# Patient Record
Sex: Male | Born: 1940 | Race: White | Hispanic: No | Marital: Married | Smoking: Never smoker
Health system: Southern US, Community
[De-identification: ages and names within clinical notes are randomized; demographics above are authoritative.]

## PROBLEM LIST (undated history)

## (undated) DIAGNOSIS — L719 Rosacea, unspecified: Secondary | ICD-10-CM

## (undated) DIAGNOSIS — K115 Sialolithiasis: Secondary | ICD-10-CM

## (undated) DIAGNOSIS — G4733 Obstructive sleep apnea (adult) (pediatric): Secondary | ICD-10-CM

## (undated) DIAGNOSIS — G4730 Sleep apnea, unspecified: Secondary | ICD-10-CM

## (undated) DIAGNOSIS — N281 Cyst of kidney, acquired: Secondary | ICD-10-CM

## (undated) DIAGNOSIS — Z8546 Personal history of malignant neoplasm of prostate: Secondary | ICD-10-CM

## (undated) DIAGNOSIS — E785 Hyperlipidemia, unspecified: Secondary | ICD-10-CM

## (undated) DIAGNOSIS — Z860101 Personal history of adenomatous and serrated colon polyps: Secondary | ICD-10-CM

## (undated) DIAGNOSIS — I10 Essential (primary) hypertension: Secondary | ICD-10-CM

## (undated) DIAGNOSIS — E1169 Type 2 diabetes mellitus with other specified complication: Secondary | ICD-10-CM

## (undated) DIAGNOSIS — E119 Type 2 diabetes mellitus without complications: Secondary | ICD-10-CM

## (undated) DIAGNOSIS — C61 Malignant neoplasm of prostate: Secondary | ICD-10-CM

## (undated) DIAGNOSIS — E559 Vitamin D deficiency, unspecified: Secondary | ICD-10-CM

## (undated) HISTORY — DX: Obstructive sleep apnea (adult) (pediatric): G47.33

## (undated) HISTORY — DX: Personal history of malignant neoplasm of prostate: Z85.46

## (undated) HISTORY — DX: Personal history of adenomatous and serrated colon polyps: Z86.0101

## (undated) HISTORY — DX: Type 2 diabetes mellitus with other specified complication: E11.69

## (undated) HISTORY — DX: Rosacea, unspecified: L71.9

## (undated) HISTORY — DX: Vitamin D deficiency, unspecified: E55.9

## (undated) HISTORY — DX: Hyperlipidemia, unspecified: E78.5

## (undated) HISTORY — PX: TONSILLECTOMY: SUR1361

## (undated) HISTORY — DX: Sialolithiasis: K11.5

## (undated) HISTORY — PX: PROSTATECTOMY: SHX69

## (undated) HISTORY — DX: Cyst of kidney, acquired: N28.1

---

## 1998-09-30 HISTORY — PX: CHOLECYSTECTOMY: SHX55

## 1999-09-12 ENCOUNTER — Encounter: Admission: RE | Admit: 1999-09-12 | Discharge: 1999-09-12 | Payer: Self-pay | Admitting: Internal Medicine

## 1999-09-12 ENCOUNTER — Encounter: Payer: Self-pay | Admitting: Internal Medicine

## 1999-09-21 ENCOUNTER — Ambulatory Visit (HOSPITAL_COMMUNITY): Admission: RE | Admit: 1999-09-21 | Discharge: 1999-09-21 | Payer: Self-pay | Admitting: Internal Medicine

## 1999-09-21 ENCOUNTER — Encounter: Payer: Self-pay | Admitting: Internal Medicine

## 1999-10-26 ENCOUNTER — Encounter (INDEPENDENT_AMBULATORY_CARE_PROVIDER_SITE_OTHER): Payer: Self-pay | Admitting: Specialist

## 1999-10-26 ENCOUNTER — Observation Stay (HOSPITAL_COMMUNITY): Admission: RE | Admit: 1999-10-26 | Discharge: 1999-10-27 | Payer: Self-pay | Admitting: Surgery

## 1999-10-26 ENCOUNTER — Encounter: Payer: Self-pay | Admitting: Surgery

## 2001-10-09 ENCOUNTER — Ambulatory Visit (HOSPITAL_BASED_OUTPATIENT_CLINIC_OR_DEPARTMENT_OTHER): Admission: RE | Admit: 2001-10-09 | Discharge: 2001-10-09 | Payer: Self-pay | Admitting: Internal Medicine

## 2001-11-13 ENCOUNTER — Ambulatory Visit (HOSPITAL_BASED_OUTPATIENT_CLINIC_OR_DEPARTMENT_OTHER): Admission: RE | Admit: 2001-11-13 | Discharge: 2001-11-13 | Payer: Self-pay | Admitting: Endocrinology

## 2006-10-30 ENCOUNTER — Encounter: Admission: RE | Admit: 2006-10-30 | Discharge: 2006-10-30 | Payer: Self-pay | Admitting: Internal Medicine

## 2007-06-23 ENCOUNTER — Encounter: Admission: RE | Admit: 2007-06-23 | Discharge: 2007-06-23 | Payer: Self-pay | Admitting: Internal Medicine

## 2010-11-05 ENCOUNTER — Other Ambulatory Visit: Payer: Self-pay | Admitting: Internal Medicine

## 2010-11-05 DIAGNOSIS — R1011 Right upper quadrant pain: Secondary | ICD-10-CM

## 2010-11-05 DIAGNOSIS — N281 Cyst of kidney, acquired: Secondary | ICD-10-CM

## 2010-11-08 ENCOUNTER — Ambulatory Visit
Admission: RE | Admit: 2010-11-08 | Discharge: 2010-11-08 | Disposition: A | Discharge status: Home / Self Care | Payer: MEDICARE | Source: Ambulatory Visit | Attending: Internal Medicine | Admitting: Internal Medicine

## 2010-11-08 DIAGNOSIS — N281 Cyst of kidney, acquired: Secondary | ICD-10-CM

## 2010-11-08 DIAGNOSIS — R1011 Right upper quadrant pain: Secondary | ICD-10-CM

## 2016-01-29 HISTORY — PX: HERNIA REPAIR: SHX51

## 2016-04-18 ENCOUNTER — Other Ambulatory Visit: Payer: Self-pay | Admitting: Gastroenterology

## 2016-05-16 ENCOUNTER — Encounter (HOSPITAL_COMMUNITY): Payer: Self-pay | Admitting: *Deleted

## 2016-05-27 ENCOUNTER — Ambulatory Visit (HOSPITAL_COMMUNITY): Payer: Medicare Other | Admitting: Registered Nurse

## 2016-05-27 ENCOUNTER — Ambulatory Visit (HOSPITAL_COMMUNITY)
Admission: RE | Admit: 2016-05-27 | Discharge: 2016-05-27 | Disposition: A | Discharge status: Home / Self Care | Payer: Medicare Other | Source: Ambulatory Visit | Attending: Gastroenterology | Admitting: Gastroenterology

## 2016-05-27 ENCOUNTER — Encounter (HOSPITAL_COMMUNITY)
Admission: RE | Disposition: A | Discharge status: Home / Self Care | Payer: Self-pay | Source: Ambulatory Visit | Attending: Gastroenterology

## 2016-05-27 ENCOUNTER — Encounter (HOSPITAL_COMMUNITY): Payer: Self-pay | Admitting: Registered Nurse

## 2016-05-27 DIAGNOSIS — Z9079 Acquired absence of other genital organ(s): Secondary | ICD-10-CM | POA: Diagnosis not present

## 2016-05-27 DIAGNOSIS — Z7984 Long term (current) use of oral hypoglycemic drugs: Secondary | ICD-10-CM | POA: Diagnosis not present

## 2016-05-27 DIAGNOSIS — E119 Type 2 diabetes mellitus without complications: Secondary | ICD-10-CM | POA: Diagnosis not present

## 2016-05-27 DIAGNOSIS — Z1211 Encounter for screening for malignant neoplasm of colon: Secondary | ICD-10-CM | POA: Insufficient documentation

## 2016-05-27 DIAGNOSIS — Z7982 Long term (current) use of aspirin: Secondary | ICD-10-CM | POA: Diagnosis not present

## 2016-05-27 DIAGNOSIS — D120 Benign neoplasm of cecum: Secondary | ICD-10-CM | POA: Insufficient documentation

## 2016-05-27 DIAGNOSIS — G4733 Obstructive sleep apnea (adult) (pediatric): Secondary | ICD-10-CM | POA: Insufficient documentation

## 2016-05-27 DIAGNOSIS — E7800 Pure hypercholesterolemia, unspecified: Secondary | ICD-10-CM | POA: Diagnosis not present

## 2016-05-27 DIAGNOSIS — Z79899 Other long term (current) drug therapy: Secondary | ICD-10-CM | POA: Insufficient documentation

## 2016-05-27 DIAGNOSIS — Z9049 Acquired absence of other specified parts of digestive tract: Secondary | ICD-10-CM | POA: Insufficient documentation

## 2016-05-27 DIAGNOSIS — Z8546 Personal history of malignant neoplasm of prostate: Secondary | ICD-10-CM | POA: Diagnosis not present

## 2016-05-27 DIAGNOSIS — I10 Essential (primary) hypertension: Secondary | ICD-10-CM | POA: Insufficient documentation

## 2016-05-27 DIAGNOSIS — Z86010 Personal history of colonic polyps: Secondary | ICD-10-CM | POA: Insufficient documentation

## 2016-05-27 HISTORY — DX: Essential (primary) hypertension: I10

## 2016-05-27 HISTORY — DX: Type 2 diabetes mellitus without complications: E11.9

## 2016-05-27 HISTORY — DX: Sleep apnea, unspecified: G47.30

## 2016-05-27 HISTORY — PX: COLONOSCOPY WITH PROPOFOL: SHX5780

## 2016-05-27 LAB — GLUCOSE, CAPILLARY: GLUCOSE-CAPILLARY: 96 mg/dL (ref 65–99)

## 2016-05-27 SURGERY — COLONOSCOPY WITH PROPOFOL
Anesthesia: Monitor Anesthesia Care

## 2016-05-27 MED ORDER — PROPOFOL 10 MG/ML IV BOLUS
INTRAVENOUS | Status: DC | PRN
  Administered 2016-05-27: 30 mg via INTRAVENOUS

## 2016-05-27 MED ORDER — PROPOFOL 10 MG/ML IV BOLUS
INTRAVENOUS | Status: AC
  Filled 2016-05-27: qty 40

## 2016-05-27 MED ORDER — SODIUM CHLORIDE 0.9 % IV SOLN: INTRAVENOUS | Status: DC

## 2016-05-27 MED ORDER — LIDOCAINE HCL (CARDIAC) 20 MG/ML IV SOLN
INTRAVENOUS | Status: DC | PRN
  Administered 2016-05-27: 100 mg via INTRAVENOUS

## 2016-05-27 MED ORDER — LIDOCAINE HCL (CARDIAC) 20 MG/ML IV SOLN
INTRAVENOUS | Status: AC
  Filled 2016-05-27: qty 5

## 2016-05-27 MED ORDER — PROPOFOL 500 MG/50ML IV EMUL
INTRAVENOUS | Status: DC | PRN
  Administered 2016-05-27: 140 ug/kg/min via INTRAVENOUS

## 2016-05-27 MED ORDER — LACTATED RINGERS IV SOLN
INTRAVENOUS | Status: DC
  Administered 2016-05-27: 1000 mL via INTRAVENOUS

## 2016-05-27 SURGICAL SUPPLY — 21 items

## 2016-05-27 NOTE — H&P (Signed)
  Procedure: Surveillance colonoscopy. 12/25/2009 colonoscopy was performed with removal of two diminutive cecal tubular adenomatous polyps  History: The patient is a 75 year old male born 03-17-1941. He is scheduled to undergo a surveillance colonoscopy today. He has obstructive sleep apnea syndrome.  Past medical history: Hypertension. Hypercholesterolemia. Obstructive sleep apnea syndrome. Allergic rhinitis. Eczema. Kidney stones. Complex kidney cyst. Hearing loss. Prostate cancer surgery. Cholecystectomy. Ventral hernia repair. Robotic prostatectomy.  Medication allergies: Crestor caused severe fatigue  Exam: The patient is alert and lying comfortably on the endoscopy stretcher. Abdomen is soft and nontender to palpation. Lungs are clear to auscultation. Cardiac exam reveals a regular rhythm.  Plan: Proceed with surveillance colonoscopy

## 2016-05-27 NOTE — Anesthesia Preprocedure Evaluation (Addendum)
Anesthesia Evaluation  Patient identified by MRN, date of birth, ID band Patient awake    Reviewed: Allergy & Precautions  Airway Mallampati: II  TM Distance: >3 FB     Dental   Pulmonary sleep apnea ,    breath sounds clear to auscultation       Cardiovascular hypertension,  Rhythm:Regular Rate:Normal     Neuro/Psych    GI/Hepatic negative GI ROS, Neg liver ROS,   Endo/Other  diabetes  Renal/GU negative Renal ROS     Musculoskeletal   Abdominal   Peds  Hematology   Anesthesia Other Findings   Reproductive/Obstetrics                             Anesthesia Physical Anesthesia Plan  ASA: III  Anesthesia Plan: MAC   Post-op Pain Management:    Induction: Intravenous  Airway Management Planned: Simple Face Mask  Additional Equipment:   Intra-op Plan:   Post-operative Plan:   Informed Consent: I have reviewed the patients History and Physical, chart, labs and discussed the procedure including the risks, benefits and alternatives for the proposed anesthesia with the patient or authorized representative who has indicated his/her understanding and acceptance.   Dental advisory given  Plan Discussed with: CRNA and Anesthesiologist  Anesthesia Plan Comments:         Anesthesia Quick Evaluation

## 2016-05-27 NOTE — Anesthesia Postprocedure Evaluation (Signed)
Anesthesia Post Note  Patient: Craig Fuentes  Procedure(s) Performed: Procedure(s) (LRB): COLONOSCOPY WITH PROPOFOL (N/A)  Patient location during evaluation: Endoscopy Anesthesia Type: MAC Pain management: pain level controlled Respiratory status: spontaneous breathing Cardiovascular status: stable Anesthetic complications: no    Last Vitals:  Vitals:   05/27/16 1130 05/27/16 1140  BP: (!) 145/47 (!) 144/50  Pulse: 61 (!) 52  Resp: 12 16  Temp:      Last Pain:  Vitals:   05/27/16 1120  TempSrc: Oral                 EDWARDS,Lonia Roane

## 2016-05-27 NOTE — Discharge Instructions (Signed)

## 2016-05-27 NOTE — Transfer of Care (Signed)
Immediate Anesthesia Transfer of Care Note  Patient: Craig Fuentes  Procedure(s) Performed: Procedure(s): COLONOSCOPY WITH PROPOFOL (N/A)  Patient Location: PACU and Endoscopy Unit  Anesthesia Type:MAC  Level of Consciousness: awake, alert , oriented and patient cooperative  Airway & Oxygen Therapy: Patient Spontanous Breathing and Patient connected to face mask oxygen  Post-op Assessment: Report given to RN, Post -op Vital signs reviewed and stable and Patient moving all extremities  Post vital signs: Reviewed and stable  Last Vitals:  Vitals:   05/27/16 1016  BP: (!) 165/52  Resp: 13  Temp: 36.7 C    Last Pain:  Vitals:   05/27/16 1016  TempSrc: Oral         Complications: No apparent anesthesia complications

## 2016-05-27 NOTE — Op Note (Signed)
[facility] Patient Name: Craig Fuentes Procedure Date: 05/27/2016 MRN: [id number] Attending MD: Garlan Fair , MD Date of Birth: 04-Jan-1941 CSN: [id number] Age: 75 Admit Type: Outpatient Procedure:                Colonoscopy Indications:              High risk colon cancer surveillance: Personal                            history of adenoma less than 10 mm in size Providers:                Garlan Fair, MD, Laverta Baltimore RN, RN, Alfonso Patten, Technician, Courtney Heys. Armistead, CRNA Referring MD:              Medicines:                Propofol per Anesthesia Complications:            No immediate complications. Estimated Blood Loss:     Estimated blood loss: none. Procedure:                Pre-Anesthesia Assessment:                           - Prior to the procedure, a History and Physical                            was performed, and patient medications and                            allergies were reviewed. The patient's tolerance of                            previous anesthesia was also reviewed. The risks                            and benefits of the procedure and the sedation                            options and risks were discussed with the patient.                            All questions were answered, and informed consent                            was obtained. Prior Anticoagulants: The patient has                            taken aspirin, last dose was 1 day prior to                            procedure. ASA Grade Assessment: III - A patient  with severe systemic disease. After reviewing the                            risks and benefits, the patient was deemed in                            satisfactory condition to undergo the procedure.                           After obtaining informed consent, the colonoscope                            was passed under direct vision. Throughout the                     procedure, the patient's blood pressure, pulse, and                            oxygen saturations were monitored continuously. The                            [id number] scope was introduced through                            the anus and advanced to the the cecum, identified                            by appendiceal orifice and ileocecal valve. The                            colonoscopy was performed without difficulty. The                            patient tolerated the procedure well. The quality                            of the bowel preparation was good. The ileocecal                            valve, the appendiceal orifice and the rectum were                            photographed. Scope In: 10:50:27 AM Scope Out: 11:12:54 AM Scope Withdrawal Time: 0 hours 16 minutes 7 seconds  Total Procedure Duration: 0 hours 22 minutes 27 seconds  Findings:      The perianal and digital rectal examinations were normal.      A 4 mm polyp was found in the cecum. The polyp was sessile. The polyp       was removed with a cold biopsy forceps. Resection and retrieval were       complete.      The exam was otherwise without abnormality. Impression:               - One 4 mm polyp in the cecum, removed with a cold  biopsy forceps. Resected and retrieved.                           - The examination was otherwise normal. Moderate Sedation:      N/A- Per Anesthesia Care Recommendation:           - Patient has a contact number available for                            emergencies. The signs and symptoms of potential                            delayed complications were discussed with the                            patient. Return to normal activities tomorrow.                            Written discharge instructions were provided to the                            patient.                           - Repeat colonoscopy date to be determined after                             pending pathology results are reviewed for                            surveillance.                           - Resume previous diet.                           - Continue present medications. Procedure Code(s):        --- Professional ---                           [phone], Colonoscopy, flexible; with biopsy, single                            or multiple Diagnosis Code(s):        --- Professional ---                           Z86.010, Personal history of colonic polyps                           D12.0, Benign neoplasm of cecum CPT copyright 2016 American Medical Association. All rights reserved. The codes documented in this report are preliminary and upon coder review may  be revised to meet current compliance requirements. Earle Gell, MD Garlan Fair, MD 05/27/2016 11:19:43 AM This report has been signed electronically. Number of Addenda: 0

## 2016-05-29 ENCOUNTER — Encounter (HOSPITAL_COMMUNITY): Payer: Self-pay | Admitting: Gastroenterology

## 2018-02-10 DIAGNOSIS — H1131 Conjunctival hemorrhage, right eye: Secondary | ICD-10-CM | POA: Insufficient documentation

## 2018-09-16 ENCOUNTER — Encounter: Payer: Self-pay | Admitting: Radiation Oncology

## 2018-09-16 ENCOUNTER — Telehealth: Payer: Self-pay | Admitting: Radiation Oncology

## 2018-09-16 NOTE — Telephone Encounter (Signed)
Phoned patient to remind him of his appointment tomorrow and encourage him to bring any records he may have with him specifically pathology. No answer. Left message detailing this information and my contact information.

## 2018-09-16 NOTE — Progress Notes (Signed)
Radiation Oncology         [phone] ________________________________  Initial outpatient Consultation  Name: Craig Fuentes MRN: [id number]  Date: 09/17/2018  DOB: November 29, 1940  [id number], Herbie Baltimore, MD  Franchot Gallo, MD   REFERRING PHYSICIAN: Franchot Gallo, MD  DIAGNOSIS: 77 y.o. 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    ICD-10-CM   1. Malignant neoplasm of prostate ([place]) C61     HISTORY OF PRESENT ILLNESS: Craig Fuentes is a 77 y.o. male with a diagnosis of biochemical recurrent prostate cancer. He was initially diagnosed at the [facility] in early 2016. He proceeded to robotic prostatectomy with bilateral pelvic lymph node dissections on 01/17/15. He was noted to have rising PSA s/p prostatectomy of 0.12 by his primary care physician, Dr. Inda Merlin.  Accordingly, he was referred for evaluation in urology by Dr. Diona Fanti on 08/17/18. His original pathology records are not currently available, but apparently PSA was around 3.5. He is scheduled to follow up with Dr. Dorina Hoyer on 11/23/18.  He apparently underwent bone scan on 01/09/18, but we don't currently have records of it here. He underwent pelvic MRI in 06/2018 which showed no pelvic abnormality or mass within the prostatic bed.   The patient reviewed his recent PSA results and the implications of a rising PSA post-prostatectomy with his urologist and he has kindly been referred today for discussion of potential salvage radiation treatment options.   PREVIOUS RADIATION THERAPY: No  PAST MEDICAL HISTORY:  Past Medical History:  Diagnosis Date  . Diabetes mellitus without complication ([place])   . Hypertension   . Prostate cancer ([place])   . Sleep apnea    use CPAP      PAST SURGICAL HISTORY: Past Surgical History:  Procedure Laterality Date  . CHOLECYSTECTOMY  2000  . COLONOSCOPY WITH PROPOFOL N/A 05/27/2016   Procedure: COLONOSCOPY  WITH PROPOFOL;  Surgeon: Garlan Fair, MD;  Location: [facility];  Service: Endoscopy;  Laterality: N/A;  . HERNIA REPAIR  17/4081   umbilical incisional hernia repair  . PROSTATECTOMY    . TONSILLECTOMY     as child    FAMILY HISTORY:  Family History  Problem Relation Age of Onset  . Cirrhosis Father   . Breast cancer Neg Hx   . Prostate cancer Neg Hx   . Pancreatic cancer Neg Hx   . Colon cancer Neg Hx     SOCIAL HISTORY:  Social History   Socioeconomic History  . Marital status: Married    Spouse name: Not on file  . Number of children: Not on file  . Years of education: Not on file  . Highest education level: Not on file  Occupational History    Comment: self employed  Social Needs  . Financial resource strain: Not on file  . Food insecurity:    Worry: Not on file    Inability: Not on file  . Transportation needs:    Medical: Not on file    Non-medical: Not on file  Tobacco Use  . Smoking status: Never Smoker  . Smokeless tobacco: Never Used  Substance and Sexual Activity  . Alcohol use: Yes  . Drug use: No  . Sexual activity: Not Currently  Lifestyle  . Physical activity:    Days per week: Not on file    Minutes per session: Not on file  . Stress: Not on file  Relationships  . Social connections:  Talks on phone: Not on file    Gets together: Not on file    Attends religious service: Not on file    Active member of club or organization: Not on file    Attends meetings of clubs or organizations: Not on file    Relationship status: Not on file  . Intimate partner violence:    Fear of current or ex partner: Not on file    Emotionally abused: Not on file    Physically abused: Not on file    Forced sexual activity: Not on file  Other Topics Concern  . Not on file  Social History Narrative  . Not on file    ALLERGIES: Rosuvastatin  MEDICATIONS:  Current Outpatient Medications  Medication Sig Dispense Refill  . amLODipine (NORVASC) 5 MG  tablet Take 2.5 mg by mouth daily.    Marland Kitchen aspirin EC 81 MG tablet Take 162 mg by mouth daily.    Marland Kitchen atorvastatin (LIPITOR) 20 MG tablet Take 20 mg by mouth daily.    Marland Kitchen b complex vitamins tablet Take 1 tablet by mouth daily.    . Biotin 5000 MCG CAPS Take 1 capsule by mouth daily.    . Cholecalciferol (VITAMIN D) 2000 units CAPS Take 1 capsule by mouth daily.    Marland Kitchen docusate sodium (COLACE) 100 MG capsule Take 100 mg by mouth 2 (two) times daily.    Marland Kitchen lisinopril-hydrochlorothiazide (PRINZIDE,ZESTORETIC) 10-12.5 MG tablet Take 1 tablet by mouth daily.    . metFORMIN (GLUCOPHAGE) 500 MG tablet Take 500 mg by mouth daily.    . potassium chloride SA (K-DUR,KLOR-CON) 20 MEQ tablet Take 20 mEq by mouth daily.     No current facility-administered medications for this encounter.     REVIEW OF SYSTEMS:  On review of systems, the patient reports that he is doing well overall. He denies any chest pain, shortness of breath, cough, fevers, chills, night sweats, unintended weight changes. He denies any bowel disturbances, and denies abdominal pain, nausea or vomiting. He denies any new musculoskeletal or joint aches or pains. His IPSS was 1, indicating mild to no urinary symptoms. His SHIM was 25; he notes using Viagra with great success. A complete review of systems is obtained and is otherwise negative.    PHYSICAL EXAM:  Wt Readings from Last 3 Encounters:  09/17/18 165 lb 6 oz (75 kg)  05/27/16 158 lb (71.7 kg)   Temp Readings from Last 3 Encounters:  09/17/18 97.6 F (36.4 C) (Oral)  05/27/16 97.4 F (36.3 C) (Oral)   BP Readings from Last 3 Encounters:  09/17/18 (!) 141/62  05/27/16 (!) 144/50   Pulse Readings from Last 3 Encounters:  09/17/18 62  05/27/16 (!) 52   Pain Assessment Pain Score: 0-No pain/10 No acute distress, unlabored breathing, alert and oriented x4, normal affect.  KPS = 100  100 - Normal; no complaints; no evidence of disease. 90   - Able to carry on normal activity;  minor signs or symptoms of disease. 80   - Normal activity with effort; some signs or symptoms of disease. 42   - Cares for self; unable to carry on normal activity or to do active work. 60   - Requires occasional assistance, but is able to care for most of his personal needs. 50   - Requires considerable assistance and frequent medical care. 28   - Disabled; requires special care and assistance. 62   - Severely disabled; hospital admission is indicated although death not  imminent. 49   - Very sick; hospital admission necessary; active supportive treatment necessary. 10   - Moribund; fatal processes progressing rapidly. 0     - Dead  Karnofsky DA, Abelmann WH, Craver LS and Burchenal JH [phone]) The use of the nitrogen mustards in the palliative treatment of carcinoma: with particular reference to bronchogenic carcinoma Cancer 1 [phone]  LABORATORY DATA:  No results found for: WBC, HGB, HCT, MCV, PLT No results found for: NA, K, CL, CO2 No results found for: ALT, AST, GGT, ALKPHOS, BILITOT   RADIOGRAPHY: No results found.    IMPRESSION/PLAN: 1. 78 y.o. gentleman with biochemically recurrent adenocarcinoma of the prostate with Gleason Score of 3+3, and post-op detectable rising PSA of 0.12. We discussed the patient's workup and outlines the nature of biochemical recurrent prostate cancer in this setting. Accordingly, he is eligible for salvage external radiation. We discussed the available radiation techniques, and focused on the details and logistics and delivery. We discussed and outlined the risks, benefits, short and long-term effects associated with radiotherapy.   We will connect him with our financial counselors to discuss costs. We will also reach out to [facility] to receive his previous pathology records (patient states his pathologist was Dr. Larena Glassman). Once we obtain the pathology report, we can determine the best form of radiation treatment based on the results.  The patient plans to see  Dr. Diona Fanti back on 2/21 after PSA on 2/17 and may elect radiotherapy at that time.  We spent time face to face with the patient and more than 50% of that time was spent in counseling and/or coordination of care.   _____________________________________  Sheral Apley. Tammi Klippel, M.D.   This document serves as a record of services personally performed by Tyler Pita, MD. It was created on his behalf by Wilburn Mylar, a trained medical scribe. The creation of this record is based on the scribe's personal observations and the provider's statements to them. This document has been checked and approved by the attending provider.

## 2018-09-16 NOTE — Progress Notes (Addendum)
GU Location of Tumor / Histology: Biochemical recurrent prostatic adenocarcinoma  If Prostate Cancer, Gleason Score is (3 + 3) and PSA (3.5) pre treatment. Prostate volume unknown.    Tania Ade underwent a robotic assisted radical prostatectomy with probable bilateral pelvic lymph node dissections in April of 2016. Following surgery his PSA went to 0 but over the past year the PSA has gone to 0.06 and 0.12 more recently.    Past/Anticipated interventions by urology, if any: prostate biopsy, prostatectomy, referral for consideration of salvage radiotherapy.  Past/Anticipated interventions by medical oncology, if any: no  Weight changes, if any: no  Bowel/Bladder complaints, if any: IPSS 1. SHIM 25. Denies significant leakage. Able to maintain erections with Viagra. Reports a good stream without difficulty emptying his bladder. Denies hematuria.   Nausea/Vomiting, if any: no  Pain issues, if any:  no, denies bony pain  SAFETY ISSUES:  Prior radiation? no  Pacemaker/ICD? no  Possible current pregnancy? no, male patient  Is the patient on methotrexate? no  Current Complaints / other details:  77 year old male. Married. Denies family history of cancer.

## 2018-09-17 ENCOUNTER — Encounter: Payer: Self-pay | Admitting: Medical Oncology

## 2018-09-17 ENCOUNTER — Encounter: Payer: Self-pay | Admitting: Radiation Oncology

## 2018-09-17 ENCOUNTER — Ambulatory Visit
Admission: RE | Admit: 2018-09-17 | Discharge: 2018-09-17 | Disposition: A | Discharge status: Home / Self Care | Payer: Medicare Other | Source: Ambulatory Visit | Attending: Radiation Oncology | Admitting: Radiation Oncology

## 2018-09-17 ENCOUNTER — Other Ambulatory Visit: Payer: Self-pay

## 2018-09-17 VITALS — BP 141/62 | HR 62 | Temp 97.6°F | Resp 18 | Ht 68.0 in | Wt 165.4 lb

## 2018-09-17 DIAGNOSIS — Z79899 Other long term (current) drug therapy: Secondary | ICD-10-CM | POA: Diagnosis not present

## 2018-09-17 DIAGNOSIS — I10 Essential (primary) hypertension: Secondary | ICD-10-CM | POA: Insufficient documentation

## 2018-09-17 DIAGNOSIS — C61 Malignant neoplasm of prostate: Secondary | ICD-10-CM

## 2018-09-17 DIAGNOSIS — E119 Type 2 diabetes mellitus without complications: Secondary | ICD-10-CM | POA: Diagnosis not present

## 2018-09-17 DIAGNOSIS — Z7984 Long term (current) use of oral hypoglycemic drugs: Secondary | ICD-10-CM | POA: Diagnosis not present

## 2018-09-17 HISTORY — DX: Malignant neoplasm of prostate: C61

## 2018-09-17 NOTE — Progress Notes (Signed)
See progress note under physician encounter. 

## 2018-09-17 NOTE — Progress Notes (Signed)
Introduced myself to Mr. Craig Fuentes as the prostate nurse navigator and my role. He states he had prostatectomy 6 years ago at [facility]. His PSA has started to rise and Dr. Inda Merlin referred him to Dr. Diona Fanti. We have limited records so not sure of surgical path results. Records have been requested. He is here to discuss radiation to the prostate fossa. I gave him my business card and asked him to call me with questions or concerns.

## 2018-10-16 ENCOUNTER — Telehealth: Payer: Self-pay | Admitting: Urology

## 2018-10-16 NOTE — Telephone Encounter (Signed)
I spoke with the patient by phone to inform him that we had received his final pathology report from the [facility] regarding his prostatectomy in 2016.  Dr. Tammi Klippel has reviewed the reports and feels that he is indeed a good candidate for early salvage radiotherapy to the prostate fossa based on his current postoperative PSA of 0.12.  We discussed the recommended course of 7-1/2 weeks of daily radiotherapy treatment to the prostate fossa delivered over 38 fractions.  In our conversation, the patient related that he lives in [place] which is approximately 1-1/2 hours away from our clinic.  He is interested in learning more about his financial responsibility as it relates to his radiation treatments prior to committing to move forward with treatment and mentiond that he might want to seek treatment closer to home due to frequency of treatments recommended.  He has a follow-up visit scheduled with Dr. Diona Fanti on 11/20/2018 and is planning to make a decision regarding moving forward with salvage radiation versus continuing in active surveillance at that time.  I sent an inbox message to Dr. Diona Fanti with this information and indicated that while we are more than happy to conitnue to participate in this gentlemen's care, we completely understand if he prefers to seek treatment closer to home.  If that is the case, Dr. Tammi Klippel has recommended considering consult with Dr. Lourena Simmonds with [facility] in [place], [place].   We will look forward to hearing back from Dr. Diona Fanti and/or the patient following his upcoming visit in February and should he elect to proceed with radiation treatment here in [place], we would be happy to move forward with treatment planning accordingly.    Nicholos Johns, MMS, PA-C [facility] [place]: [phone]  Fax: [phone]

## 2018-11-20 ENCOUNTER — Ambulatory Visit
Admission: RE | Admit: 2018-11-20 | Discharge: 2018-11-20 | Disposition: A | Discharge status: Home / Self Care | Payer: Self-pay | Source: Ambulatory Visit | Attending: Radiation Oncology | Admitting: Radiation Oncology

## 2018-11-20 ENCOUNTER — Other Ambulatory Visit: Payer: Self-pay | Admitting: Radiation Oncology

## 2018-11-20 DIAGNOSIS — C61 Malignant neoplasm of prostate: Secondary | ICD-10-CM

## 2018-11-30 ENCOUNTER — Telehealth: Payer: Self-pay | Admitting: Medical Oncology

## 2018-11-30 NOTE — Telephone Encounter (Signed)
Spoke with Craig Fuentes to follow up on his prostate treatment decision. He states that he has decided on radiation and is being treated at [facility]. He appreciated the care and concern here at [facility] but has received all previous treatments at [facility] and felt comfortable going back for radiation. I wished him well and and encouraged him to reach out to [place] if he should need [place] in the future.

## 2020-10-06 DIAGNOSIS — E119 Type 2 diabetes mellitus without complications: Secondary | ICD-10-CM | POA: Diagnosis not present

## 2020-10-06 DIAGNOSIS — I10 Essential (primary) hypertension: Secondary | ICD-10-CM | POA: Diagnosis not present

## 2020-10-06 DIAGNOSIS — E782 Mixed hyperlipidemia: Secondary | ICD-10-CM | POA: Diagnosis not present

## 2020-10-09 DIAGNOSIS — R7989 Other specified abnormal findings of blood chemistry: Secondary | ICD-10-CM | POA: Diagnosis not present

## 2020-10-27 DIAGNOSIS — E119 Type 2 diabetes mellitus without complications: Secondary | ICD-10-CM | POA: Diagnosis not present

## 2020-11-10 DIAGNOSIS — E782 Mixed hyperlipidemia: Secondary | ICD-10-CM | POA: Diagnosis not present

## 2020-11-10 DIAGNOSIS — E119 Type 2 diabetes mellitus without complications: Secondary | ICD-10-CM | POA: Diagnosis not present

## 2020-11-10 DIAGNOSIS — I10 Essential (primary) hypertension: Secondary | ICD-10-CM | POA: Diagnosis not present

## 2020-12-01 DIAGNOSIS — H9190 Unspecified hearing loss, unspecified ear: Secondary | ICD-10-CM | POA: Diagnosis not present

## 2020-12-26 DIAGNOSIS — E782 Mixed hyperlipidemia: Secondary | ICD-10-CM | POA: Diagnosis not present

## 2020-12-26 DIAGNOSIS — I10 Essential (primary) hypertension: Secondary | ICD-10-CM | POA: Diagnosis not present

## 2020-12-26 DIAGNOSIS — E119 Type 2 diabetes mellitus without complications: Secondary | ICD-10-CM | POA: Diagnosis not present

## 2021-01-06 DIAGNOSIS — E119 Type 2 diabetes mellitus without complications: Secondary | ICD-10-CM | POA: Diagnosis not present

## 2021-01-06 DIAGNOSIS — E782 Mixed hyperlipidemia: Secondary | ICD-10-CM | POA: Diagnosis not present

## 2021-01-06 DIAGNOSIS — I10 Essential (primary) hypertension: Secondary | ICD-10-CM | POA: Diagnosis not present

## 2021-01-17 DIAGNOSIS — H93A1 Pulsatile tinnitus, right ear: Secondary | ICD-10-CM | POA: Diagnosis not present

## 2021-01-17 DIAGNOSIS — H903 Sensorineural hearing loss, bilateral: Secondary | ICD-10-CM | POA: Diagnosis not present

## 2021-01-30 ENCOUNTER — Other Ambulatory Visit: Payer: Self-pay | Admitting: Physician Assistant

## 2021-01-30 DIAGNOSIS — H93A1 Pulsatile tinnitus, right ear: Secondary | ICD-10-CM

## 2021-01-30 DIAGNOSIS — I10 Essential (primary) hypertension: Secondary | ICD-10-CM | POA: Diagnosis not present

## 2021-01-30 DIAGNOSIS — E782 Mixed hyperlipidemia: Secondary | ICD-10-CM | POA: Diagnosis not present

## 2021-01-30 DIAGNOSIS — E119 Type 2 diabetes mellitus without complications: Secondary | ICD-10-CM | POA: Diagnosis not present

## 2021-02-14 ENCOUNTER — Ambulatory Visit
Admission: RE | Admit: 2021-02-14 | Discharge: 2021-02-14 | Disposition: A | Discharge status: Home / Self Care | Payer: Medicare Other | Source: Ambulatory Visit | Attending: Physician Assistant | Admitting: Physician Assistant

## 2021-02-14 ENCOUNTER — Other Ambulatory Visit: Payer: Self-pay

## 2021-02-14 DIAGNOSIS — H93A1 Pulsatile tinnitus, right ear: Secondary | ICD-10-CM

## 2021-02-14 DIAGNOSIS — I6503 Occlusion and stenosis of bilateral vertebral arteries: Secondary | ICD-10-CM | POA: Diagnosis not present

## 2021-02-14 MED ORDER — IOPAMIDOL (ISOVUE-370) INJECTION 76%
75.0000 mL | Freq: Once | INTRAVENOUS | Status: AC | PRN
  Administered 2021-02-14: 75 mL via INTRAVENOUS

## 2021-03-07 DIAGNOSIS — I10 Essential (primary) hypertension: Secondary | ICD-10-CM | POA: Diagnosis not present

## 2021-03-07 DIAGNOSIS — E559 Vitamin D deficiency, unspecified: Secondary | ICD-10-CM | POA: Diagnosis not present

## 2021-03-07 DIAGNOSIS — Z0000 Encounter for general adult medical examination without abnormal findings: Secondary | ICD-10-CM | POA: Diagnosis not present

## 2021-03-07 DIAGNOSIS — E782 Mixed hyperlipidemia: Secondary | ICD-10-CM | POA: Diagnosis not present

## 2021-03-07 DIAGNOSIS — R002 Palpitations: Secondary | ICD-10-CM | POA: Diagnosis not present

## 2021-03-07 DIAGNOSIS — E119 Type 2 diabetes mellitus without complications: Secondary | ICD-10-CM | POA: Diagnosis not present

## 2021-04-06 DIAGNOSIS — E782 Mixed hyperlipidemia: Secondary | ICD-10-CM | POA: Diagnosis not present

## 2021-04-06 DIAGNOSIS — E119 Type 2 diabetes mellitus without complications: Secondary | ICD-10-CM | POA: Diagnosis not present

## 2021-04-06 DIAGNOSIS — I10 Essential (primary) hypertension: Secondary | ICD-10-CM | POA: Diagnosis not present

## 2021-04-09 DIAGNOSIS — R002 Palpitations: Secondary | ICD-10-CM | POA: Diagnosis not present

## 2021-04-09 DIAGNOSIS — I6523 Occlusion and stenosis of bilateral carotid arteries: Secondary | ICD-10-CM | POA: Diagnosis not present

## 2021-04-20 DIAGNOSIS — R002 Palpitations: Secondary | ICD-10-CM | POA: Diagnosis not present

## 2021-05-03 DIAGNOSIS — I10 Essential (primary) hypertension: Secondary | ICD-10-CM | POA: Diagnosis not present

## 2021-05-03 DIAGNOSIS — R002 Palpitations: Secondary | ICD-10-CM | POA: Diagnosis not present

## 2021-06-07 DIAGNOSIS — K115 Sialolithiasis: Secondary | ICD-10-CM | POA: Diagnosis not present

## 2021-08-13 DIAGNOSIS — L280 Lichen simplex chronicus: Secondary | ICD-10-CM | POA: Diagnosis not present

## 2021-08-13 DIAGNOSIS — Z85828 Personal history of other malignant neoplasm of skin: Secondary | ICD-10-CM | POA: Diagnosis not present

## 2021-08-13 DIAGNOSIS — L814 Other melanin hyperpigmentation: Secondary | ICD-10-CM | POA: Diagnosis not present

## 2021-08-13 DIAGNOSIS — L570 Actinic keratosis: Secondary | ICD-10-CM | POA: Diagnosis not present

## 2021-08-13 DIAGNOSIS — L821 Other seborrheic keratosis: Secondary | ICD-10-CM | POA: Diagnosis not present

## 2021-09-04 DIAGNOSIS — E559 Vitamin D deficiency, unspecified: Secondary | ICD-10-CM | POA: Diagnosis not present

## 2021-09-04 DIAGNOSIS — Z7984 Long term (current) use of oral hypoglycemic drugs: Secondary | ICD-10-CM | POA: Diagnosis not present

## 2021-09-04 DIAGNOSIS — Z79899 Other long term (current) drug therapy: Secondary | ICD-10-CM | POA: Diagnosis not present

## 2021-09-04 DIAGNOSIS — E119 Type 2 diabetes mellitus without complications: Secondary | ICD-10-CM | POA: Diagnosis not present

## 2021-09-04 DIAGNOSIS — E782 Mixed hyperlipidemia: Secondary | ICD-10-CM | POA: Diagnosis not present

## 2021-09-04 DIAGNOSIS — R002 Palpitations: Secondary | ICD-10-CM | POA: Diagnosis not present

## 2021-09-04 DIAGNOSIS — Z0000 Encounter for general adult medical examination without abnormal findings: Secondary | ICD-10-CM | POA: Diagnosis not present

## 2021-09-04 DIAGNOSIS — I10 Essential (primary) hypertension: Secondary | ICD-10-CM | POA: Diagnosis not present

## 2021-11-19 IMAGING — CT CT ANGIO HEAD
2 of 4 series · 6 of 30 positions shown · IV contrast (APPLIED)
Comparison: None.

CLINICAL DATA: Pulsatile tinnitus of right ear.

EXAM:
CT ANGIOGRAPHY HEAD
TECHNIQUE: Multidetector CT imaging of the head was performed using the
standard protocol during bolus administration of intravenous
contrast. Multiplanar CT image reconstructions and MIPs were
obtained to evaluate the vascular anatomy.
CONTRAST:  75mL B064PA-C9T IOPAMIDOL (B064PA-C9T) INJECTION 76%

[Series 3: head w/(date) · axial · 0.42mm/px · z∈[-129,-74]mm · 2 of 34 slices shown]
[im 12/34  brain]
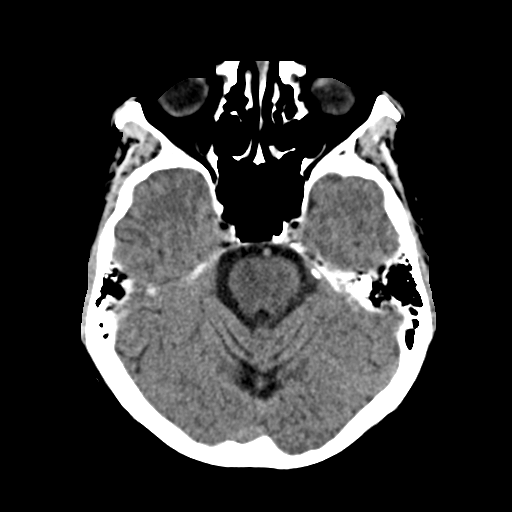
[im 23/34  brain]
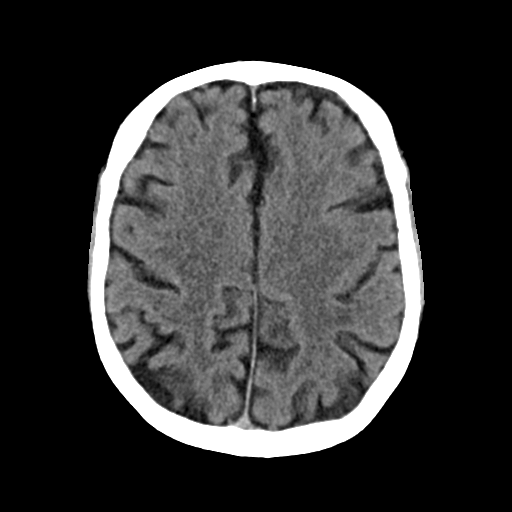

[Series 7: head angio · axial · 0.42mm/px · z∈[-152,-53]mm · 4 of 57 slices shown]
[im 12/57  brain]
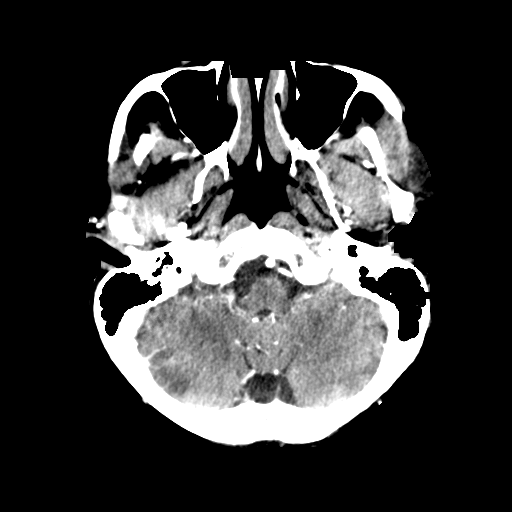
[im 23/57  bone]
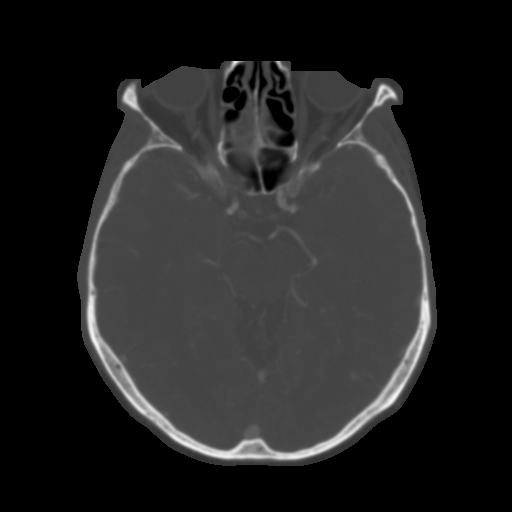
[im 34/57  brain]
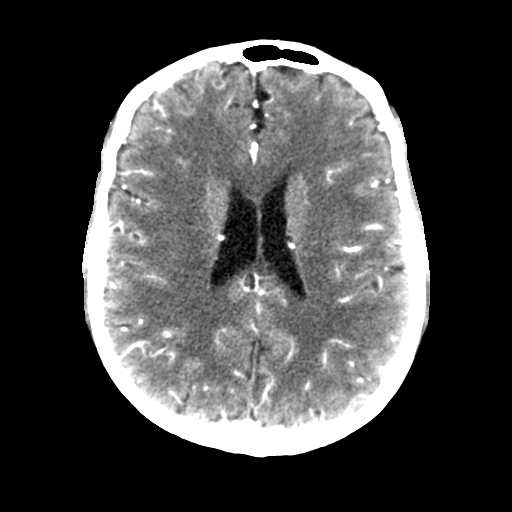
[im 45/57  bone]
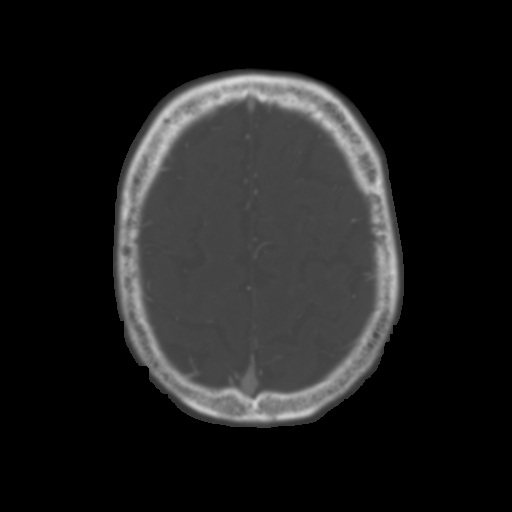

[6 of 30 positions shown; findings below may reference images not displayed]

FINDINGS: CT HEAD

Brain: No evidence of acute infarction, hemorrhage, hydrocephalus,
extra-axial collection or mass lesion/mass effect.

Vascular: No hyperdense vessel or unexpected calcification.

Skull: Normal. Negative for fracture or focal lesion.

Sinuses: No acute or significant finding.

Other: None.

CTA HEAD

Anterior circulation: Calcified plaques in the bilateral carotid
siphons. No significant stenosis, proximal occlusion, aneurysm, or
vascular malformation.

Posterior circulation: Diminutive left vertebral artery with short
segment of decreased contrast opacification proximal to the origin
of the left PICA, at the level of a calcific focus (series 13, image
17; series 15, image 4), likely related to atherosclerotic stenosis.
The right vertebral artery, basilar artery and bilateral posterior
cerebral arteries are maintained.

Venous sinuses: As permitted by contrast timing, patent.

Anatomic variants: None.

Review of the MIP images confirms the above findings.
IMPRESSION: 1. Diminutive left vertebral artery with high-grade stenosis at the
V4 segment, proximal to the origin of the left PICA.
2. No high-grade stenosis of the anterior circulation.
3. No evidence of aneurysm, AVM or dural AV fistula.

## 2022-03-20 DIAGNOSIS — R002 Palpitations: Secondary | ICD-10-CM | POA: Diagnosis not present

## 2022-03-20 DIAGNOSIS — I10 Essential (primary) hypertension: Secondary | ICD-10-CM | POA: Diagnosis not present

## 2022-03-20 DIAGNOSIS — E119 Type 2 diabetes mellitus without complications: Secondary | ICD-10-CM | POA: Diagnosis not present

## 2022-03-20 DIAGNOSIS — E559 Vitamin D deficiency, unspecified: Secondary | ICD-10-CM | POA: Diagnosis not present

## 2022-03-20 DIAGNOSIS — E782 Mixed hyperlipidemia: Secondary | ICD-10-CM | POA: Diagnosis not present

## 2022-03-20 DIAGNOSIS — H9190 Unspecified hearing loss, unspecified ear: Secondary | ICD-10-CM | POA: Diagnosis not present

## 2022-08-19 DIAGNOSIS — L570 Actinic keratosis: Secondary | ICD-10-CM | POA: Diagnosis not present

## 2022-08-19 DIAGNOSIS — L821 Other seborrheic keratosis: Secondary | ICD-10-CM | POA: Diagnosis not present

## 2022-08-19 DIAGNOSIS — D485 Neoplasm of uncertain behavior of skin: Secondary | ICD-10-CM | POA: Diagnosis not present

## 2022-08-19 DIAGNOSIS — D1801 Hemangioma of skin and subcutaneous tissue: Secondary | ICD-10-CM | POA: Diagnosis not present

## 2022-08-19 DIAGNOSIS — L814 Other melanin hyperpigmentation: Secondary | ICD-10-CM | POA: Diagnosis not present

## 2022-08-19 DIAGNOSIS — Z85828 Personal history of other malignant neoplasm of skin: Secondary | ICD-10-CM | POA: Diagnosis not present

## 2022-10-21 DIAGNOSIS — E782 Mixed hyperlipidemia: Secondary | ICD-10-CM | POA: Diagnosis not present

## 2022-10-21 DIAGNOSIS — D509 Iron deficiency anemia, unspecified: Secondary | ICD-10-CM | POA: Diagnosis not present

## 2022-10-21 DIAGNOSIS — Z0000 Encounter for general adult medical examination without abnormal findings: Secondary | ICD-10-CM | POA: Diagnosis not present

## 2022-10-21 DIAGNOSIS — G4733 Obstructive sleep apnea (adult) (pediatric): Secondary | ICD-10-CM | POA: Diagnosis not present

## 2022-10-21 DIAGNOSIS — E1169 Type 2 diabetes mellitus with other specified complication: Secondary | ICD-10-CM | POA: Diagnosis not present

## 2022-10-21 DIAGNOSIS — I10 Essential (primary) hypertension: Secondary | ICD-10-CM | POA: Diagnosis not present

## 2022-10-21 DIAGNOSIS — Z79899 Other long term (current) drug therapy: Secondary | ICD-10-CM | POA: Diagnosis not present

## 2022-10-21 DIAGNOSIS — E559 Vitamin D deficiency, unspecified: Secondary | ICD-10-CM | POA: Diagnosis not present

## 2022-11-18 DIAGNOSIS — D649 Anemia, unspecified: Secondary | ICD-10-CM | POA: Diagnosis not present

## 2022-11-20 DIAGNOSIS — D649 Anemia, unspecified: Secondary | ICD-10-CM | POA: Diagnosis not present

## 2023-04-10 DIAGNOSIS — H93A3 Pulsatile tinnitus, bilateral: Secondary | ICD-10-CM | POA: Diagnosis not present

## 2023-04-10 DIAGNOSIS — I499 Cardiac arrhythmia, unspecified: Secondary | ICD-10-CM | POA: Diagnosis not present

## 2023-04-10 DIAGNOSIS — R002 Palpitations: Secondary | ICD-10-CM | POA: Diagnosis not present

## 2023-04-10 DIAGNOSIS — I951 Orthostatic hypotension: Secondary | ICD-10-CM | POA: Diagnosis not present

## 2023-04-23 DIAGNOSIS — I951 Orthostatic hypotension: Secondary | ICD-10-CM | POA: Diagnosis not present

## 2023-04-23 DIAGNOSIS — I499 Cardiac arrhythmia, unspecified: Secondary | ICD-10-CM | POA: Diagnosis not present

## 2023-04-24 DIAGNOSIS — R002 Palpitations: Secondary | ICD-10-CM | POA: Diagnosis not present

## 2023-05-15 DIAGNOSIS — I4729 Other ventricular tachycardia: Secondary | ICD-10-CM | POA: Diagnosis not present

## 2023-05-15 DIAGNOSIS — E119 Type 2 diabetes mellitus without complications: Secondary | ICD-10-CM | POA: Diagnosis not present

## 2023-05-15 DIAGNOSIS — I951 Orthostatic hypotension: Secondary | ICD-10-CM | POA: Diagnosis not present

## 2023-05-15 DIAGNOSIS — E1169 Type 2 diabetes mellitus with other specified complication: Secondary | ICD-10-CM | POA: Diagnosis not present

## 2023-05-15 DIAGNOSIS — H93A3 Pulsatile tinnitus, bilateral: Secondary | ICD-10-CM | POA: Diagnosis not present

## 2023-05-19 DIAGNOSIS — R002 Palpitations: Secondary | ICD-10-CM | POA: Diagnosis not present

## 2023-06-06 ENCOUNTER — Other Ambulatory Visit (INDEPENDENT_AMBULATORY_CARE_PROVIDER_SITE_OTHER): Payer: Self-pay | Admitting: Physician Assistant

## 2023-06-06 DIAGNOSIS — H9313 Tinnitus, bilateral: Secondary | ICD-10-CM

## 2023-06-06 DIAGNOSIS — H9193 Unspecified hearing loss, bilateral: Secondary | ICD-10-CM

## 2023-07-03 ENCOUNTER — Ambulatory Visit: Payer: Medicare Other | Admitting: Audiologist

## 2023-07-16 ENCOUNTER — Ambulatory Visit (INDEPENDENT_AMBULATORY_CARE_PROVIDER_SITE_OTHER): Payer: Medicare Other | Admitting: Otolaryngology

## 2023-07-16 ENCOUNTER — Encounter (INDEPENDENT_AMBULATORY_CARE_PROVIDER_SITE_OTHER): Payer: Self-pay

## 2023-07-16 ENCOUNTER — Ambulatory Visit (INDEPENDENT_AMBULATORY_CARE_PROVIDER_SITE_OTHER): Payer: Medicare Other | Admitting: Audiology

## 2023-07-16 VITALS — Ht 67.0 in | Wt 158.0 lb

## 2023-07-16 DIAGNOSIS — H903 Sensorineural hearing loss, bilateral: Secondary | ICD-10-CM

## 2023-07-16 DIAGNOSIS — H9313 Tinnitus, bilateral: Secondary | ICD-10-CM

## 2023-07-16 NOTE — Progress Notes (Unsigned)
[address] [place], [place] [address] [phone]      Audiological Evaluation   Name: Craig Fuentes     DOB:   01-28-1941      MRN:   [id number]                                                                                     Service Date: 07/16/2023       Patient was referred today for a hearing evaluation by Dr. Karle Barr.  History Symptoms Yes/No Details  Hearing loss Yes Patient reported perceiving hearing loss where the left ear is worse than the right ear.  Tinnitus Yes Patient reported experiencing pulsating tinnitus where the left ear is louder than the right.  Balance problems No Patient denied vertigo/imbalance sensations.  Previous ear surgeries No Patient denied any previous ear surgeries.  Amplification Yes Patient reported use of hearing aids.    Tympanogram: Right ear: Normal external ear canal volume with normal middle ear pressure and tympanic membrane compliance (Type A). Left ear: Normal external ear canal volume with normal middle ear pressure and tympanic membrane compliance (Type A).    Hearing Evaluation: The audiogram was completed using conventional audiometric techniques under headphones with good reliability.   The hearing test results indicate: Right ear: Mild sensorineural hearing loss from 250-500 Hz gradually sloping to profound sensorineural hearing loss from 1000-8000 Hz. Left ear:  Mild sensorineural hearing loss from 250-500 Hz gradually sloping to profound sensorineural hearing loss from 1000-8000 Hz.  Speech Recognition Thresholds were obtained at 45 dBHL in the right ear and 45 dBHL in the left ear.   Word Recognition Testing was completed using the NU-6 word lists at 85 dBHL in the right ear and at 85 dBHL in the left ear and the patient scored 56% in the right ear and 64% in the left ear.   Recommendations: Repeat audiogram when changes are perceived or per MD. Continue use of hearing aids. Consider various  tinnitus strategies, including the use of a noise generator, hearing aids, or tinnitus retraining therapy.   Conley Rolls Levester Waldridge, AUD, CCC-A 07/16/23

## 2023-07-21 DIAGNOSIS — H903 Sensorineural hearing loss, bilateral: Secondary | ICD-10-CM | POA: Insufficient documentation

## 2023-07-21 NOTE — Progress Notes (Unsigned)
Patient ID: Craig Fuentes, male   DOB: 18-Jun-1941, 82 y.o.   MRN: [id number]  CC: Bilateral pulsatile tinnitus  HPI:  Craig Fuentes is an 82 y.o. male who presents today complaining of intermittent bilateral pulsatile tinnitus.  The tinnitus is worse on the left side.  The pulsatile tinnitus synchronizes to his heartbeat.  He denies any otalgia or otorrhea.  He denies any recent otitis media or otitis externa.  He has no previous ENT surgery.  The patient has a history of bilateral high-frequency sensorineural hearing loss.  He currently wears bilateral hearing aids.  He denies any recent change in his hearing.  Past Medical History:  Diagnosis Date   Diabetes mellitus without complication (HCC)    Hypertension    Prostate cancer (HCC)    Sleep apnea    use CPAP    Past Surgical History:  Procedure Laterality Date   CHOLECYSTECTOMY  2000   COLONOSCOPY WITH PROPOFOL N/A 05/27/2016   Procedure: COLONOSCOPY WITH PROPOFOL;  Surgeon: Charolett Bumpers, MD;  Location: [facility];  Service: Endoscopy;  Laterality: N/A;   HERNIA REPAIR  01/2016   umbilical incisional hernia repair   PROSTATECTOMY     TONSILLECTOMY     as child    Family History  Problem Relation Age of Onset   Cirrhosis Father    Breast cancer Neg Hx    Prostate cancer Neg Hx    Pancreatic cancer Neg Hx    Colon cancer Neg Hx     Social History:  reports that he has never smoked. He has never used smokeless tobacco. He reports current alcohol use. He reports that he does not use drugs.  Allergies:  Allergies  Allergen Reactions   Rosuvastatin Other (See Comments)    Extreme fatigue    Prior to Admission medications   Medication Sig Start Date End Date Taking? Authorizing Provider  amLODipine (NORVASC) 5 MG tablet Take 2.5 mg by mouth daily.   Yes [provider]  aspirin EC 81 MG tablet Take 162 mg by mouth daily.   Yes [provider]  atorvastatin (LIPITOR) 20 MG tablet Take 20 mg by  mouth daily.   Yes [provider]  b complex vitamins tablet Take 1 tablet by mouth daily.   Yes [provider]  Biotin 5000 MCG CAPS Take 1 capsule by mouth daily.   Yes [provider]  Cholecalciferol (VITAMIN D) 2000 units CAPS Take 1 capsule by mouth daily.   Yes [provider]  docusate sodium (COLACE) 100 MG capsule Take 100 mg by mouth 2 (two) times daily.   Yes [provider]  lisinopril-hydrochlorothiazide (PRINZIDE,ZESTORETIC) 10-12.5 MG tablet Take 1 tablet by mouth daily.   Yes [provider]  metFORMIN (GLUCOPHAGE) 500 MG tablet Take 500 mg by mouth daily.   Yes [provider]  potassium chloride SA (K-DUR,KLOR-CON) 20 MEQ tablet Take 20 mEq by mouth daily.   Yes [provider]   Height 5\' 7"  (1.702 m), weight 71.7 kg. Exam: General: Communicates without difficulty, well nourished, no acute distress. Head: Normocephalic, no evidence injury, no tenderness, facial buttresses intact without stepoff. Face/sinus: No tenderness to palpation and percussion. Facial movement is normal and symmetric. Eyes: PERRL, EOMI. No scleral icterus, conjunctivae clear. Neuro: CN II exam reveals vision grossly intact.  No nystagmus at any point of gaze. Ears: Auricles well formed without lesions.  Ear canals are intact without mass or lesion.  No erythema or edema  is appreciated.  The TMs are intact without fluid. Nose: External evaluation reveals normal support and skin without lesions.  Dorsum is intact.  Anterior rhinoscopy reveals normal mucosa over anterior aspect of inferior turbinates and intact septum.  No purulence noted. Oral:  Oral cavity and oropharynx are intact, symmetric, without erythema or edema.  Mucosa is moist without lesions. Neck: Full range of motion without pain.  There is no significant lymphadenopathy.  No masses palpable.  Thyroid bed within normal limits to palpation.  Parotid glands and submandibular glands  equal bilaterally without mass.  Trachea is midline. Neuro:  CN 2-12 grossly intact.    His hearing test shows bilateral symmetric high-frequency sensorineural hearing loss.  Assessment: Pulsatile tinnitus of unknown etiology.  The possible differential diagnoses include vascular tumor, AV malformation, hyperdynamic state, aneurysm, sinus thrombosis, or other idiopathic causes.  Bilateral high-frequency sensorineural hearing loss, likely secondary to presbycusis. His ear canals, tympanic membranes, and middle ear spaces are normal.  Plan: 1.  The physical exam findings and the hearing test results are reviewed with the patient. 2.  The strategies to cope with tinnitus, including the use of masker, hearing aids, tinnitus retraining therapy, and avoidance of caffeine and alcohol are discussed.  3.  MRI/CT angiography to evaluate for possible causes of his pulsatile tinnitus. 4.  The patient will return for reevaluation after his imaging studies.  Celisa Schoenberg W Breken Nazari 07/21/2023, 10:01 AM

## 2023-07-25 DIAGNOSIS — Z79899 Other long term (current) drug therapy: Secondary | ICD-10-CM | POA: Diagnosis not present

## 2023-07-31 ENCOUNTER — Ambulatory Visit (HOSPITAL_COMMUNITY)
Admission: RE | Admit: 2023-07-31 | Discharge: 2023-07-31 | Disposition: A | Discharge status: Home / Self Care | Payer: Medicare Other | Source: Ambulatory Visit | Attending: Otolaryngology | Admitting: Otolaryngology

## 2023-07-31 DIAGNOSIS — H9313 Tinnitus, bilateral: Secondary | ICD-10-CM | POA: Diagnosis not present

## 2023-07-31 DIAGNOSIS — I6782 Cerebral ischemia: Secondary | ICD-10-CM | POA: Diagnosis not present

## 2023-07-31 DIAGNOSIS — I6502 Occlusion and stenosis of left vertebral artery: Secondary | ICD-10-CM | POA: Diagnosis not present

## 2023-07-31 DIAGNOSIS — R9089 Other abnormal findings on diagnostic imaging of central nervous system: Secondary | ICD-10-CM | POA: Diagnosis not present

## 2023-07-31 DIAGNOSIS — H93A3 Pulsatile tinnitus, bilateral: Secondary | ICD-10-CM | POA: Diagnosis not present

## 2023-07-31 MED ORDER — GADOBUTROL 1 MMOL/ML IV SOLN
7.0000 mL | Freq: Once | INTRAVENOUS | Status: AC | PRN
  Administered 2023-07-31: 7 mL via INTRAVENOUS

## 2023-08-01 ENCOUNTER — Other Ambulatory Visit (INDEPENDENT_AMBULATORY_CARE_PROVIDER_SITE_OTHER): Payer: Self-pay | Admitting: Otolaryngology

## 2023-08-01 DIAGNOSIS — H9312 Tinnitus, left ear: Secondary | ICD-10-CM

## 2023-08-04 NOTE — Progress Notes (Unsigned)
Cardiology Office Note:  .    Date:  08/06/2023  ID:  Craig Fuentes, DOB Sep 28, 1941, MRN [id number] PCP: Thana Ates, MD  [facility] Providers Cardiologist:  None     CC: PVCs Consulted for the evaluation of VT NOS at the behest of Dr. Margaretann Fuentes   History of Present Illness: .    Craig Fuentes is a 82 y.o. male with a history of HLD with rosuvastatin myopathy, Symptomatic NSVT, OSA, HTN with DM. Hx of orthostatic hypotension.  Was having frequent PVCs and SVT.  Normal echo per OSH reporting.  Discussed the use of AI scribe software for clinical note transcription with the patient, who gave verbal consent to proceed.  Mr. Craig Fuentes, a 82 year old individual with a history of hyperlipidemia, hypertension, and diabetes, presents with symptomatic non-sustained ventricular tachycardia and frequent ectopy. The patient reports feeling a pounding sensation in his ears, which he describes as being in sync with his heartbeat. This sensation is noted to occur both during the day and at night, with the intensity remaining constant throughout each episode. The patient also reports experiencing a high pulse rate, reaching into the 90s at times.  Over the past year, the patient has noticed an increase in his blood pressure, which is highest upon waking and decreases with activity. Despite an increase in his Lisinopril dosage from 5mg  to 40mg , the patient reports feeling fatigued and 'wrung out,' with these symptoms alleviating after resting for 30 minutes to an hour.  The patient also reports occasional chest pain and shooting pain in his arm. He has a family history of coronary artery disease, with a younger brother recently undergoing a triple bypass surgery.  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 He also reports a history of minor strokes, with MRI results indicating mild  chronic small vessel ischemic changes within the cerebral white matter.   Relevant histories: .  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   Studies Reviewed: Marland Kitchen      RADIOLOGY MRI: No cardiac origin findings. 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  DIAGNOSTIC Echocardiogram: Normal function. Heart monitor: Frequent non-sustained ventricular tachycardia, longest 8 beats, evidence of aberrant conduction, supraventricular tachycardia, frequent premature ventricular contractions with occasional couplets. EKG: No premature ventricular contractions, no arrhythmias (08/05/2023).   Physical Exam:    VS:  BP (!) 160/74 (BP Location: Left Arm, Patient Position: Sitting, Cuff Size: Normal)   Pulse (!) 57   Resp 10   Ht 5\' 7"  (1.702 m)   Wt 157 lb 3.2 oz (71.3 kg)   SpO2 97%   BMI 24.62 kg/m    Wt Readings from Last 3 Encounters:  08/06/23 157 lb 3.2 oz (71.3 kg)  07/16/23 158 lb (71.7 kg)  09/17/18 165 lb 6 oz (75 kg)    Gen: no distress  Neck: No JVD Ears: Bilateral Homero Fellers Sign Cardiac: No Rubs or Gallops, systolic Murmur, Rrr +2radial pulses Respiratory: Clear to auscultation bilaterally, normal effort, normal  respiratory rate GI: Soft, nontender, non-distended  MS: No  edema;  moves all extremities Integument: Skin feels warm Neuro:  At time of evaluation, alert and oriented to person/place/time/situation  Psych: Normal affect, patient feels well  ASSESSMENT AND PLAN: .    Non-sustained Ventricular Tachycardia (NSVT), SVT and Premature Ventricular Contractions (PVCs) Symptomatic with palpitations. EKG showed no PVCs during the visit. Concern for coronary artery disease given family history and presence of cholesterol buildup  in earlobes. -Start Carvedilol 3.125mg  BID to suppress PVCs. -Order PET MPI study to assess for coronary artery disease. -Consider referral to electrophysiologist if symptoms persist and coronary artery disease is ruled out. (Flecainide if no CAD vs Cath or aggressive medical therapy)  Hypertension 0 Labile blood pressure with episodes of orthostatic hypotension and hypertension. Currently on Lisinopril 40mg  daily. -Continue Lisinopril 40mg  daily. -Monitor blood pressure closely with BB start  Hyperlipidemia with Statin Myopathy History of intolerance to statins. Presence of cholesterol buildup in earlobes suggestive of hyperlipidemia. -Defer cholesterol management plan until after initial assessment for coronary artery disease.  Cerebral Small Vessel Ischemic Changes CT/MRI findings suggestive of minor strokes. -Start Aspirin 81mg  daily for secondary prevention.  Follow-up in 6-8 weeks to reassess symptoms and review results of PET MPS study. If high-risk disease is identified, consider cardiac catheterization.   Craig Lam, MD FASE [facility] Cardiologist [facility]  [address] [place], [address] [place], [place] [address] [phone]  2:28 PM

## 2023-08-06 ENCOUNTER — Ambulatory Visit: Payer: Medicare Other | Attending: Internal Medicine | Admitting: Internal Medicine

## 2023-08-06 ENCOUNTER — Encounter: Payer: Self-pay | Admitting: Internal Medicine

## 2023-08-06 VITALS — BP 160/74 | HR 57 | Resp 10 | Ht 67.0 in | Wt 157.2 lb

## 2023-08-06 DIAGNOSIS — I10 Essential (primary) hypertension: Secondary | ICD-10-CM | POA: Diagnosis not present

## 2023-08-06 DIAGNOSIS — I493 Ventricular premature depolarization: Secondary | ICD-10-CM | POA: Diagnosis not present

## 2023-08-06 DIAGNOSIS — I4729 Other ventricular tachycardia: Secondary | ICD-10-CM | POA: Insufficient documentation

## 2023-08-06 DIAGNOSIS — E782 Mixed hyperlipidemia: Secondary | ICD-10-CM | POA: Diagnosis not present

## 2023-08-06 MED ORDER — CARVEDILOL 3.125 MG PO TABS: 3.1250 mg | ORAL_TABLET | Freq: Two times a day (BID) | ORAL | 3 refills | Status: AC

## 2023-08-06 MED ORDER — ASPIRIN 81 MG PO TBEC: 81.0000 mg | DELAYED_RELEASE_TABLET | Freq: Every day | ORAL | Status: AC

## 2023-08-06 NOTE — Patient Instructions (Addendum)
Mr. Saine, during your visit today, we discussed your recent symptoms of a pounding sensation in your ears, high pulse rate, and increased blood pressure. We also reviewed your history of hyperlipidemia, hypertension, diabetes, and minor strokes. Given your family history of coronary artery disease, we are taking steps to assess and manage your heart health.  YOUR PLAN:  -NON-SUSTAINED VENTRICULAR TACHYCARDIA (NSVT) AND PREMATURE VENTRICULAR CONTRACTIONS (PVCS): NSVT and PVCs are types of irregular heartbeats that can cause palpitations. We are starting you on Carvedilol 3.125mg  twice daily to help suppress these irregular heartbeats. Additionally, we have ordered a PET MPS study to check for coronary artery disease. If your symptoms continue and coronary artery disease is ruled out, we may refer you to a heart rhythm specialist.  -HYPERTENSION: Hypertension is high blood pressure, which can lead to serious health problems if not managed. Your blood pressure has been fluctuating, so we will continue your current medication, Lisinopril 40mg  daily, and closely monitor your blood pressure.  -HYPERLIPIDEMIA WITH STATIN MYOPATHY: Hyperlipidemia is high cholesterol, which can increase the risk of heart disease. You have had issues with cholesterol medications in the past. We will hold off on making any changes to your cholesterol management until we have more information from your heart assessment.  -CEREBRAL SMALL VESSEL ISCHEMIC CHANGES: These are changes in the small blood vessels in your brain, likely from minor strokes. To help prevent further strokes, we are starting you on Aspirin 81mg  daily.  INSTRUCTIONS:  Please follow up in 6-8 weeks to reassess your symptoms and review the results of your PET MPI study. If we find any high-risk heart disease, we may need to consider further testing, such as a cardiac catheterization.    Medication Instructions:  START COREG/ CARVEDILOL 3.125 MG TWICE A  DAY START ASPIRIN 81 MG A DAY   *If you need a refill on your cardiac medications before your next appointment, please call your pharmacy*   Lab Work:  If you have labs (blood work) drawn today and your tests are completely normal, you will receive your results only by: MyChart Message (if you have MyChart) OR A paper copy in the mail If you have any lab test that is abnormal or we need to change your treatment, we will call you to review the results.   Testing/Procedures:  How to Prepare for Your Cardiac PET/CT Stress Test:  1. Please do not take these medications before your test:   Medications that may interfere with the cardiac pharmacological stress agent (ex. nitrates - including erectile dysfunction medications, isosorbide mononitrate- [please start to hold COREG the day before the test] AND the day of the exam. (Erectile dysfunction medication should be held for at least 72 hrs prior to test) Your remaining medications may be taken with water.  2. Nothing to eat or drink, except water, 3 hours prior to arrival time.   NO caffeine/decaffeinated products, or chocolate 12 hours prior to arrival.  3. NO perfume, cologne or lotion on chest or abdomen area.           4. Total time is 1 to 2 hours; you may want to bring reading material for the waiting time.  5. Please report to Radiology at the [facility] Main Entrance 30 minutes early for your test.  [address] [place], [place] [address]  6. Please report to Radiology at [facility] Main Entrance, medical mall, 30 mins prior to your test.  [address]  [place], [place]  [phone]  Diabetic Preparation:  Hold oral medications.(METFORMIN) If able to eat breakfast prior to 3 hour fasting, you may take all medications. Patients who wear a continuous glucose monitor MUST remove the device prior to scanning.    In preparation for your appointment, medication and supplies  will be purchased.  Appointment availability is limited, so if you need to cancel or reschedule, please call the Radiology Department at [phone] [facility]) OR [phone] [facility])  24 hours in advance to avoid a cancellation fee of $100.00  What to Expect After you Arrive:  Once you arrive and check in for your appointment, you will be taken to a preparation room within the Radiology Department.  A technologist or Nurse will obtain your medical history, verify that you are correctly prepped for the exam, and explain the procedure.  Afterwards,  an IV will be started in your arm and electrodes will be placed on your skin for EKG monitoring during the stress portion of the exam. Then you will be escorted to the PET/CT scanner.  There, staff will get you positioned on the scanner and obtain a blood pressure and EKG.  During the exam, you will continue to be connected to the EKG and blood pressure machines.  A small, safe amount of a radioactive tracer will be injected in your IV to obtain a series of pictures of your heart along with an injection of a stress agent.    After your Exam:  It is recommended that you eat a meal and drink a caffeinated beverage to counter act any effects of the stress agent.  Drink plenty of fluids for the remainder of the day and urinate frequently for the first couple of hours after the exam.  Your doctor will inform you of your test results within 7-10 business days.  For more information and frequently asked questions, please visit our website : [email or web address]  For questions about your test or how to prepare for your test, please call: Cardiac Imaging Nurse Navigators Office: [phone]     Follow-Up: At [facility], you and your health needs are our priority.  As part of our continuing mission to provide you with exceptional heart care, we have created designated Provider Care Teams.  These Care Teams include your primary  Cardiologist (physician) and Advanced Practice Providers (APPs -  Physician Assistants and Nurse Practitioners) who all work together to provide you with the care you need, when you need it.  We recommend signing up for the patient portal called "MyChart".  Sign up information is provided on this After Visit Summary.  MyChart is used to connect with patients for Virtual Visits (Telemedicine).  Patients are able to view lab/test results, encounter notes, upcoming appointments, etc.  Non-urgent messages can be sent to your provider as well.   To learn more about what you can do with MyChart, go to [email or web address].    Your next appointment:  6-8 WEEKS WITH DR Izora Ribas OR APP

## 2023-08-19 ENCOUNTER — Encounter (HOSPITAL_COMMUNITY): Payer: Self-pay

## 2023-08-21 ENCOUNTER — Ambulatory Visit
Admission: RE | Admit: 2023-08-21 | Discharge: 2023-08-21 | Disposition: A | Discharge status: Home / Self Care | Payer: Medicare Other | Source: Ambulatory Visit | Attending: Internal Medicine | Admitting: Internal Medicine

## 2023-08-21 DIAGNOSIS — I493 Ventricular premature depolarization: Secondary | ICD-10-CM | POA: Diagnosis not present

## 2023-08-21 DIAGNOSIS — I700 Atherosclerosis of aorta: Secondary | ICD-10-CM | POA: Diagnosis not present

## 2023-08-21 DIAGNOSIS — R918 Other nonspecific abnormal finding of lung field: Secondary | ICD-10-CM | POA: Insufficient documentation

## 2023-08-21 DIAGNOSIS — I4729 Other ventricular tachycardia: Secondary | ICD-10-CM | POA: Diagnosis not present

## 2023-08-21 LAB — NM PET CT CARDIAC PERFUSION MULTI W/ABSOLUTE BLOODFLOW
MBFR: 1.83
Nuc Rest EF: 58 %
Nuc Stress EF: 69 %
Peak HR: 89 {beats}/min
Rest HR: 56 {beats}/min
Rest MBF: 0.76 ml/g/min
Rest Nuclear Isotope Dose: 18.6 mCi
SRS: 0
SSS: 0
ST Depression (mm): 0 mm
Stress MBF: 1.39 ml/g/min
Stress Nuclear Isotope Dose: 18.6 mCi
TID: 1.06

## 2023-08-21 MED ORDER — RUBIDIUM RB82 GENERATOR (RUBYFILL)
25.0000 | PACK | Freq: Once | INTRAVENOUS | Status: AC
  Administered 2023-08-21: 18.55 via INTRAVENOUS

## 2023-08-21 MED ORDER — REGADENOSON 0.4 MG/5ML IV SOLN
INTRAVENOUS | Status: AC
  Filled 2023-08-21: qty 5

## 2023-08-21 MED ORDER — REGADENOSON 0.4 MG/5ML IV SOLN
0.4000 mg | Freq: Once | INTRAVENOUS | Status: AC
  Administered 2023-08-21: 0.4 mg via INTRAVENOUS
  Filled 2023-08-21: qty 5

## 2023-08-21 MED ORDER — RUBIDIUM RB82 GENERATOR (RUBYFILL)
25.0000 | PACK | Freq: Once | INTRAVENOUS | Status: AC
  Administered 2023-08-21: 18.64 via INTRAVENOUS

## 2023-08-21 NOTE — Progress Notes (Signed)
Patient presents for a cardiac PET stress test and tolerated procedure without incident. Patient maintained acceptable vital signs throughout the test and was offered caffeine after test.  Patient ambulated out of department with a steady gait.  

## 2023-08-25 DIAGNOSIS — Z85828 Personal history of other malignant neoplasm of skin: Secondary | ICD-10-CM | POA: Diagnosis not present

## 2023-08-25 DIAGNOSIS — L570 Actinic keratosis: Secondary | ICD-10-CM | POA: Diagnosis not present

## 2023-08-25 DIAGNOSIS — L814 Other melanin hyperpigmentation: Secondary | ICD-10-CM | POA: Diagnosis not present

## 2023-08-25 DIAGNOSIS — L218 Other seborrheic dermatitis: Secondary | ICD-10-CM | POA: Diagnosis not present

## 2023-08-25 DIAGNOSIS — L821 Other seborrheic keratosis: Secondary | ICD-10-CM | POA: Diagnosis not present

## 2023-08-25 DIAGNOSIS — D692 Other nonthrombocytopenic purpura: Secondary | ICD-10-CM | POA: Diagnosis not present

## 2023-08-26 ENCOUNTER — Telehealth: Payer: Self-pay

## 2023-08-26 DIAGNOSIS — R911 Solitary pulmonary nodule: Secondary | ICD-10-CM

## 2023-08-26 DIAGNOSIS — E782 Mixed hyperlipidemia: Secondary | ICD-10-CM

## 2023-08-26 NOTE — Telephone Encounter (Signed)
The patient has been notified of the result and verbalized understanding.  All questions (if any) were answered. Macie Burows, RN 08/26/2023 12:51 PM   Pt expresses will have labs drawn tomorrow.  Advised can go to any [employer] pt will go to one in [place].  Orders placed and released.  Order for pulmonary nodule clinic placed.

## 2023-08-26 NOTE — Telephone Encounter (Signed)
-----   Message from Christell Constant sent at 08/21/2023  9:41 PM EST ----- Results: Pulmonary Nodules Normal Perfusion Improvement of NSVT  Plan: Pulmonary nodule clinic referral Lipids and Lpa  Christell Constant, MD

## 2023-08-27 ENCOUNTER — Encounter: Payer: Self-pay | Admitting: Internal Medicine

## 2023-08-27 DIAGNOSIS — E782 Mixed hyperlipidemia: Secondary | ICD-10-CM | POA: Diagnosis not present

## 2023-08-28 LAB — LIPID PANEL
Chol/HDL Ratio: 3.3 {ratio} (ref 0.0–5.0)
Cholesterol, Total: 139 mg/dL (ref 100–199)
HDL: 42 mg/dL (ref >39)
LDL Chol Calc (NIH): 77 mg/dL (ref 0–99)
Triglycerides: 110 mg/dL (ref 0–149)
VLDL Cholesterol Cal: 20 mg/dL (ref 5–40)

## 2023-08-28 LAB — LIPOPROTEIN A (LPA): Lipoprotein (a): <8.4 nmol/L (ref <75.0)

## 2023-09-01 DIAGNOSIS — R300 Dysuria: Secondary | ICD-10-CM | POA: Diagnosis not present

## 2023-09-02 ENCOUNTER — Telehealth: Payer: Self-pay | Admitting: Internal Medicine

## 2023-09-02 DIAGNOSIS — R42 Dizziness and giddiness: Secondary | ICD-10-CM | POA: Diagnosis not present

## 2023-09-02 DIAGNOSIS — E782 Mixed hyperlipidemia: Secondary | ICD-10-CM

## 2023-09-02 DIAGNOSIS — I10 Essential (primary) hypertension: Secondary | ICD-10-CM | POA: Diagnosis not present

## 2023-09-02 NOTE — Telephone Encounter (Signed)
The patient has been notified of the result and verbalized understanding.  All questions (if any) were answered. Arvid Right Cederic Mozley, RN 09/02/2023 4:44 PM   Pt reports was not fasting for blood work.  Advised pt will send this information to MD to advise if medication change is still needed.  Or if would like to recheck labs in 3 months.

## 2023-09-02 NOTE — Telephone Encounter (Signed)
Patient is returning call to discuss lab results. 

## 2023-09-03 NOTE — Telephone Encounter (Signed)
Called pt advised of LDL goal < 70.  Pt was not fasting during lab work LDL 77.  Pt will continue current medication and recheck FLP in March.  Advised pt go to any [employer] in March to have fasting labs drawn.  Pt expresses understanding.  Orders placed and released for draw.

## 2023-09-12 DIAGNOSIS — E782 Mixed hyperlipidemia: Secondary | ICD-10-CM | POA: Diagnosis not present

## 2023-09-13 LAB — LIPID PANEL
Chol/HDL Ratio: 3.5 {ratio} (ref 0.0–5.0)
Cholesterol, Total: 138 mg/dL (ref 100–199)
HDL: 40 mg/dL (ref >39)
LDL Chol Calc (NIH): 67 mg/dL (ref 0–99)
Triglycerides: 183 mg/dL — ABNORMAL HIGH (ref 0–149)
VLDL Cholesterol Cal: 31 mg/dL (ref 5–40)

## 2023-09-13 LAB — ALT: ALT: 14 [IU]/L (ref 0–44)

## 2023-09-18 ENCOUNTER — Ambulatory Visit: Payer: Medicare Other | Admitting: Physician Assistant

## 2023-10-20 DIAGNOSIS — I10 Essential (primary) hypertension: Secondary | ICD-10-CM | POA: Diagnosis not present

## 2023-10-20 DIAGNOSIS — I2510 Atherosclerotic heart disease of native coronary artery without angina pectoris: Secondary | ICD-10-CM | POA: Diagnosis not present

## 2023-10-20 DIAGNOSIS — H9313 Tinnitus, bilateral: Secondary | ICD-10-CM | POA: Diagnosis not present

## 2023-10-20 DIAGNOSIS — E782 Mixed hyperlipidemia: Secondary | ICD-10-CM | POA: Diagnosis not present

## 2023-10-20 DIAGNOSIS — I493 Ventricular premature depolarization: Secondary | ICD-10-CM | POA: Diagnosis not present

## 2023-10-30 ENCOUNTER — Ambulatory Visit: Payer: Medicare Other | Admitting: Physician Assistant

## 2023-11-04 DIAGNOSIS — I119 Hypertensive heart disease without heart failure: Secondary | ICD-10-CM | POA: Diagnosis not present

## 2023-11-06 DIAGNOSIS — Z79899 Other long term (current) drug therapy: Secondary | ICD-10-CM | POA: Diagnosis not present

## 2023-11-06 DIAGNOSIS — E1169 Type 2 diabetes mellitus with other specified complication: Secondary | ICD-10-CM | POA: Diagnosis not present

## 2023-11-06 DIAGNOSIS — I951 Orthostatic hypotension: Secondary | ICD-10-CM | POA: Diagnosis not present

## 2023-11-06 DIAGNOSIS — I4729 Other ventricular tachycardia: Secondary | ICD-10-CM | POA: Diagnosis not present

## 2023-11-06 DIAGNOSIS — E7800 Pure hypercholesterolemia, unspecified: Secondary | ICD-10-CM | POA: Diagnosis not present

## 2023-11-06 DIAGNOSIS — H93A3 Pulsatile tinnitus, bilateral: Secondary | ICD-10-CM | POA: Diagnosis not present

## 2023-11-06 DIAGNOSIS — I10 Essential (primary) hypertension: Secondary | ICD-10-CM | POA: Diagnosis not present

## 2023-11-06 DIAGNOSIS — Z0000 Encounter for general adult medical examination without abnormal findings: Secondary | ICD-10-CM | POA: Diagnosis not present

## 2023-11-06 DIAGNOSIS — D649 Anemia, unspecified: Secondary | ICD-10-CM | POA: Diagnosis not present

## 2023-11-06 DIAGNOSIS — R911 Solitary pulmonary nodule: Secondary | ICD-10-CM | POA: Diagnosis not present

## 2023-11-28 DIAGNOSIS — I119 Hypertensive heart disease without heart failure: Secondary | ICD-10-CM | POA: Diagnosis not present

## 2023-12-17 DIAGNOSIS — I119 Hypertensive heart disease without heart failure: Secondary | ICD-10-CM | POA: Diagnosis not present

## 2023-12-25 DIAGNOSIS — R918 Other nonspecific abnormal finding of lung field: Secondary | ICD-10-CM | POA: Diagnosis not present

## 2023-12-29 DIAGNOSIS — I119 Hypertensive heart disease without heart failure: Secondary | ICD-10-CM | POA: Diagnosis not present

## 2024-01-01 DIAGNOSIS — D692 Other nonthrombocytopenic purpura: Secondary | ICD-10-CM | POA: Diagnosis not present

## 2024-01-01 DIAGNOSIS — L570 Actinic keratosis: Secondary | ICD-10-CM | POA: Diagnosis not present

## 2024-01-01 DIAGNOSIS — D1801 Hemangioma of skin and subcutaneous tissue: Secondary | ICD-10-CM | POA: Diagnosis not present

## 2024-01-01 DIAGNOSIS — L720 Epidermal cyst: Secondary | ICD-10-CM | POA: Diagnosis not present

## 2024-01-01 DIAGNOSIS — L820 Inflamed seborrheic keratosis: Secondary | ICD-10-CM | POA: Diagnosis not present

## 2024-01-01 DIAGNOSIS — L219 Seborrheic dermatitis, unspecified: Secondary | ICD-10-CM | POA: Diagnosis not present

## 2024-01-01 DIAGNOSIS — L821 Other seborrheic keratosis: Secondary | ICD-10-CM | POA: Diagnosis not present

## 2024-01-01 DIAGNOSIS — L578 Other skin changes due to chronic exposure to nonionizing radiation: Secondary | ICD-10-CM | POA: Diagnosis not present

## 2024-01-01 DIAGNOSIS — D229 Melanocytic nevi, unspecified: Secondary | ICD-10-CM | POA: Diagnosis not present

## 2024-01-21 DIAGNOSIS — I119 Hypertensive heart disease without heart failure: Secondary | ICD-10-CM | POA: Diagnosis not present

## 2024-01-28 DIAGNOSIS — I119 Hypertensive heart disease without heart failure: Secondary | ICD-10-CM | POA: Diagnosis not present

## 2024-03-05 DIAGNOSIS — I10 Essential (primary) hypertension: Secondary | ICD-10-CM | POA: Diagnosis not present

## 2024-03-10 DIAGNOSIS — I10 Essential (primary) hypertension: Secondary | ICD-10-CM | POA: Diagnosis not present

## 2024-03-10 DIAGNOSIS — I2510 Atherosclerotic heart disease of native coronary artery without angina pectoris: Secondary | ICD-10-CM | POA: Diagnosis not present

## 2024-03-10 DIAGNOSIS — E119 Type 2 diabetes mellitus without complications: Secondary | ICD-10-CM | POA: Diagnosis not present

## 2024-03-10 DIAGNOSIS — E785 Hyperlipidemia, unspecified: Secondary | ICD-10-CM | POA: Diagnosis not present

## 2024-03-10 DIAGNOSIS — Z888 Allergy status to other drugs, medicaments and biological substances status: Secondary | ICD-10-CM | POA: Diagnosis not present

## 2024-03-10 DIAGNOSIS — I214 Non-ST elevation (NSTEMI) myocardial infarction: Secondary | ICD-10-CM | POA: Diagnosis not present

## 2024-03-10 DIAGNOSIS — I071 Rheumatic tricuspid insufficiency: Secondary | ICD-10-CM | POA: Diagnosis not present

## 2024-03-10 DIAGNOSIS — Z7982 Long term (current) use of aspirin: Secondary | ICD-10-CM | POA: Diagnosis not present

## 2024-03-10 DIAGNOSIS — E1169 Type 2 diabetes mellitus with other specified complication: Secondary | ICD-10-CM | POA: Diagnosis not present

## 2024-03-10 DIAGNOSIS — E871 Hypo-osmolality and hyponatremia: Secondary | ICD-10-CM | POA: Diagnosis not present

## 2024-03-10 DIAGNOSIS — G4733 Obstructive sleep apnea (adult) (pediatric): Secondary | ICD-10-CM | POA: Diagnosis not present

## 2024-03-10 DIAGNOSIS — R079 Chest pain, unspecified: Secondary | ICD-10-CM | POA: Diagnosis not present

## 2024-03-10 DIAGNOSIS — D649 Anemia, unspecified: Secondary | ICD-10-CM | POA: Diagnosis not present

## 2024-03-11 DIAGNOSIS — Z01811 Encounter for preprocedural respiratory examination: Secondary | ICD-10-CM | POA: Diagnosis not present

## 2024-03-11 DIAGNOSIS — Z79899 Other long term (current) drug therapy: Secondary | ICD-10-CM | POA: Diagnosis not present

## 2024-03-11 DIAGNOSIS — Z8673 Personal history of transient ischemic attack (TIA), and cerebral infarction without residual deficits: Secondary | ICD-10-CM | POA: Diagnosis not present

## 2024-03-11 DIAGNOSIS — I6522 Occlusion and stenosis of left carotid artery: Secondary | ICD-10-CM | POA: Diagnosis not present

## 2024-03-11 DIAGNOSIS — E1169 Type 2 diabetes mellitus with other specified complication: Secondary | ICD-10-CM | POA: Diagnosis not present

## 2024-03-11 DIAGNOSIS — Z0189 Encounter for other specified special examinations: Secondary | ICD-10-CM | POA: Diagnosis not present

## 2024-03-11 DIAGNOSIS — I2510 Atherosclerotic heart disease of native coronary artery without angina pectoris: Secondary | ICD-10-CM | POA: Diagnosis not present

## 2024-03-11 DIAGNOSIS — E785 Hyperlipidemia, unspecified: Secondary | ICD-10-CM | POA: Diagnosis not present

## 2024-03-11 DIAGNOSIS — G4733 Obstructive sleep apnea (adult) (pediatric): Secondary | ICD-10-CM | POA: Diagnosis not present

## 2024-03-11 DIAGNOSIS — Z888 Allergy status to other drugs, medicaments and biological substances status: Secondary | ICD-10-CM | POA: Diagnosis not present

## 2024-03-11 DIAGNOSIS — Z7982 Long term (current) use of aspirin: Secondary | ICD-10-CM | POA: Diagnosis not present

## 2024-03-11 DIAGNOSIS — I70201 Unspecified atherosclerosis of native arteries of extremities, right leg: Secondary | ICD-10-CM | POA: Diagnosis not present

## 2024-03-11 DIAGNOSIS — I214 Non-ST elevation (NSTEMI) myocardial infarction: Secondary | ICD-10-CM | POA: Diagnosis not present

## 2024-03-11 DIAGNOSIS — E119 Type 2 diabetes mellitus without complications: Secondary | ICD-10-CM | POA: Diagnosis not present

## 2024-03-11 DIAGNOSIS — I10 Essential (primary) hypertension: Secondary | ICD-10-CM | POA: Diagnosis not present

## 2024-03-11 DIAGNOSIS — J9811 Atelectasis: Secondary | ICD-10-CM | POA: Diagnosis not present

## 2024-03-11 DIAGNOSIS — I4729 Other ventricular tachycardia: Secondary | ICD-10-CM | POA: Diagnosis not present

## 2024-03-11 DIAGNOSIS — I255 Ischemic cardiomyopathy: Secondary | ICD-10-CM | POA: Diagnosis not present

## 2024-03-11 DIAGNOSIS — Z7984 Long term (current) use of oral hypoglycemic drugs: Secondary | ICD-10-CM | POA: Diagnosis not present

## 2024-03-11 DIAGNOSIS — I4719 Other supraventricular tachycardia: Secondary | ICD-10-CM | POA: Diagnosis not present

## 2024-03-11 DIAGNOSIS — M25551 Pain in right hip: Secondary | ICD-10-CM | POA: Diagnosis not present

## 2024-03-11 DIAGNOSIS — I071 Rheumatic tricuspid insufficiency: Secondary | ICD-10-CM | POA: Diagnosis not present

## 2024-03-12 DIAGNOSIS — I214 Non-ST elevation (NSTEMI) myocardial infarction: Secondary | ICD-10-CM | POA: Diagnosis not present

## 2024-03-12 DIAGNOSIS — J9811 Atelectasis: Secondary | ICD-10-CM | POA: Diagnosis not present

## 2024-03-12 DIAGNOSIS — I2510 Atherosclerotic heart disease of native coronary artery without angina pectoris: Secondary | ICD-10-CM | POA: Diagnosis not present

## 2024-03-13 DIAGNOSIS — I2510 Atherosclerotic heart disease of native coronary artery without angina pectoris: Secondary | ICD-10-CM | POA: Diagnosis not present

## 2024-03-13 DIAGNOSIS — I6522 Occlusion and stenosis of left carotid artery: Secondary | ICD-10-CM | POA: Diagnosis not present

## 2024-03-13 DIAGNOSIS — I214 Non-ST elevation (NSTEMI) myocardial infarction: Secondary | ICD-10-CM | POA: Diagnosis not present

## 2024-03-14 DIAGNOSIS — E119 Type 2 diabetes mellitus without complications: Secondary | ICD-10-CM | POA: Diagnosis not present

## 2024-03-14 DIAGNOSIS — I214 Non-ST elevation (NSTEMI) myocardial infarction: Secondary | ICD-10-CM | POA: Diagnosis not present

## 2024-03-14 DIAGNOSIS — I2510 Atherosclerotic heart disease of native coronary artery without angina pectoris: Secondary | ICD-10-CM | POA: Diagnosis not present

## 2024-03-14 DIAGNOSIS — I70201 Unspecified atherosclerosis of native arteries of extremities, right leg: Secondary | ICD-10-CM | POA: Diagnosis not present

## 2024-03-15 DIAGNOSIS — Z0189 Encounter for other specified special examinations: Secondary | ICD-10-CM | POA: Diagnosis not present

## 2024-03-15 DIAGNOSIS — I214 Non-ST elevation (NSTEMI) myocardial infarction: Secondary | ICD-10-CM | POA: Diagnosis not present

## 2024-03-15 DIAGNOSIS — Z01811 Encounter for preprocedural respiratory examination: Secondary | ICD-10-CM | POA: Diagnosis not present

## 2024-03-15 DIAGNOSIS — I2510 Atherosclerotic heart disease of native coronary artery without angina pectoris: Secondary | ICD-10-CM | POA: Diagnosis not present

## 2024-03-16 DIAGNOSIS — I2510 Atherosclerotic heart disease of native coronary artery without angina pectoris: Secondary | ICD-10-CM | POA: Diagnosis not present

## 2024-03-16 DIAGNOSIS — I214 Non-ST elevation (NSTEMI) myocardial infarction: Secondary | ICD-10-CM | POA: Diagnosis not present

## 2024-03-17 DIAGNOSIS — I2510 Atherosclerotic heart disease of native coronary artery without angina pectoris: Secondary | ICD-10-CM | POA: Diagnosis not present

## 2024-03-17 DIAGNOSIS — I214 Non-ST elevation (NSTEMI) myocardial infarction: Secondary | ICD-10-CM | POA: Diagnosis not present

## 2024-03-22 DIAGNOSIS — I252 Old myocardial infarction: Secondary | ICD-10-CM | POA: Diagnosis not present

## 2024-03-29 DIAGNOSIS — I119 Hypertensive heart disease without heart failure: Secondary | ICD-10-CM | POA: Diagnosis not present

## 2024-03-30 DIAGNOSIS — I255 Ischemic cardiomyopathy: Secondary | ICD-10-CM | POA: Diagnosis not present

## 2024-03-30 DIAGNOSIS — I10 Essential (primary) hypertension: Secondary | ICD-10-CM | POA: Diagnosis not present

## 2024-04-16 DIAGNOSIS — I255 Ischemic cardiomyopathy: Secondary | ICD-10-CM | POA: Diagnosis not present

## 2024-04-23 DIAGNOSIS — Z9861 Coronary angioplasty status: Secondary | ICD-10-CM | POA: Diagnosis not present

## 2024-04-23 DIAGNOSIS — I214 Non-ST elevation (NSTEMI) myocardial infarction: Secondary | ICD-10-CM | POA: Diagnosis not present

## 2024-04-23 DIAGNOSIS — Z955 Presence of coronary angioplasty implant and graft: Secondary | ICD-10-CM | POA: Diagnosis not present

## 2024-04-23 DIAGNOSIS — I2510 Atherosclerotic heart disease of native coronary artery without angina pectoris: Secondary | ICD-10-CM | POA: Diagnosis not present

## 2024-04-26 DIAGNOSIS — Z955 Presence of coronary angioplasty implant and graft: Secondary | ICD-10-CM | POA: Diagnosis not present

## 2024-04-26 DIAGNOSIS — I214 Non-ST elevation (NSTEMI) myocardial infarction: Secondary | ICD-10-CM | POA: Diagnosis not present

## 2024-04-26 DIAGNOSIS — I2510 Atherosclerotic heart disease of native coronary artery without angina pectoris: Secondary | ICD-10-CM | POA: Diagnosis not present

## 2024-04-26 DIAGNOSIS — Z9861 Coronary angioplasty status: Secondary | ICD-10-CM | POA: Diagnosis not present

## 2024-04-27 DIAGNOSIS — I214 Non-ST elevation (NSTEMI) myocardial infarction: Secondary | ICD-10-CM | POA: Diagnosis not present

## 2024-04-27 DIAGNOSIS — I2510 Atherosclerotic heart disease of native coronary artery without angina pectoris: Secondary | ICD-10-CM | POA: Diagnosis not present

## 2024-04-27 DIAGNOSIS — Z955 Presence of coronary angioplasty implant and graft: Secondary | ICD-10-CM | POA: Diagnosis not present

## 2024-04-27 DIAGNOSIS — Z9861 Coronary angioplasty status: Secondary | ICD-10-CM | POA: Diagnosis not present

## 2024-04-29 DIAGNOSIS — Z955 Presence of coronary angioplasty implant and graft: Secondary | ICD-10-CM | POA: Diagnosis not present

## 2024-04-29 DIAGNOSIS — I214 Non-ST elevation (NSTEMI) myocardial infarction: Secondary | ICD-10-CM | POA: Diagnosis not present

## 2024-04-29 DIAGNOSIS — I2510 Atherosclerotic heart disease of native coronary artery without angina pectoris: Secondary | ICD-10-CM | POA: Diagnosis not present

## 2024-04-29 DIAGNOSIS — Z9861 Coronary angioplasty status: Secondary | ICD-10-CM | POA: Diagnosis not present

## 2024-05-03 DIAGNOSIS — I214 Non-ST elevation (NSTEMI) myocardial infarction: Secondary | ICD-10-CM | POA: Diagnosis not present

## 2024-05-03 DIAGNOSIS — Z955 Presence of coronary angioplasty implant and graft: Secondary | ICD-10-CM | POA: Diagnosis not present

## 2024-05-03 DIAGNOSIS — I2510 Atherosclerotic heart disease of native coronary artery without angina pectoris: Secondary | ICD-10-CM | POA: Diagnosis not present

## 2024-05-03 DIAGNOSIS — Z9861 Coronary angioplasty status: Secondary | ICD-10-CM | POA: Diagnosis not present

## 2024-05-04 DIAGNOSIS — I10 Essential (primary) hypertension: Secondary | ICD-10-CM | POA: Diagnosis not present

## 2024-05-04 DIAGNOSIS — I255 Ischemic cardiomyopathy: Secondary | ICD-10-CM | POA: Diagnosis not present

## 2024-05-06 DIAGNOSIS — I2510 Atherosclerotic heart disease of native coronary artery without angina pectoris: Secondary | ICD-10-CM | POA: Diagnosis not present

## 2024-05-06 DIAGNOSIS — I214 Non-ST elevation (NSTEMI) myocardial infarction: Secondary | ICD-10-CM | POA: Diagnosis not present

## 2024-05-06 DIAGNOSIS — Z955 Presence of coronary angioplasty implant and graft: Secondary | ICD-10-CM | POA: Diagnosis not present

## 2024-05-06 DIAGNOSIS — Z9861 Coronary angioplasty status: Secondary | ICD-10-CM | POA: Diagnosis not present

## 2024-05-10 DIAGNOSIS — I2510 Atherosclerotic heart disease of native coronary artery without angina pectoris: Secondary | ICD-10-CM | POA: Diagnosis not present

## 2024-05-10 DIAGNOSIS — Z9861 Coronary angioplasty status: Secondary | ICD-10-CM | POA: Diagnosis not present

## 2024-05-10 DIAGNOSIS — I214 Non-ST elevation (NSTEMI) myocardial infarction: Secondary | ICD-10-CM | POA: Diagnosis not present

## 2024-05-10 DIAGNOSIS — Z955 Presence of coronary angioplasty implant and graft: Secondary | ICD-10-CM | POA: Diagnosis not present

## 2024-05-11 DIAGNOSIS — Z955 Presence of coronary angioplasty implant and graft: Secondary | ICD-10-CM | POA: Diagnosis not present

## 2024-05-11 DIAGNOSIS — I2510 Atherosclerotic heart disease of native coronary artery without angina pectoris: Secondary | ICD-10-CM | POA: Diagnosis not present

## 2024-05-11 DIAGNOSIS — Z9861 Coronary angioplasty status: Secondary | ICD-10-CM | POA: Diagnosis not present

## 2024-05-11 DIAGNOSIS — I214 Non-ST elevation (NSTEMI) myocardial infarction: Secondary | ICD-10-CM | POA: Diagnosis not present

## 2024-05-12 DIAGNOSIS — I5030 Unspecified diastolic (congestive) heart failure: Secondary | ICD-10-CM | POA: Diagnosis not present

## 2024-05-12 DIAGNOSIS — E782 Mixed hyperlipidemia: Secondary | ICD-10-CM | POA: Diagnosis not present

## 2024-05-12 DIAGNOSIS — I255 Ischemic cardiomyopathy: Secondary | ICD-10-CM | POA: Diagnosis not present

## 2024-05-12 DIAGNOSIS — I2510 Atherosclerotic heart disease of native coronary artery without angina pectoris: Secondary | ICD-10-CM | POA: Diagnosis not present

## 2024-05-12 DIAGNOSIS — R7989 Other specified abnormal findings of blood chemistry: Secondary | ICD-10-CM | POA: Diagnosis not present

## 2024-05-12 DIAGNOSIS — G4733 Obstructive sleep apnea (adult) (pediatric): Secondary | ICD-10-CM | POA: Diagnosis not present

## 2024-05-13 DIAGNOSIS — Z9861 Coronary angioplasty status: Secondary | ICD-10-CM | POA: Diagnosis not present

## 2024-05-13 DIAGNOSIS — I252 Old myocardial infarction: Secondary | ICD-10-CM | POA: Diagnosis not present

## 2024-05-13 DIAGNOSIS — R0689 Other abnormalities of breathing: Secondary | ICD-10-CM | POA: Diagnosis not present

## 2024-05-13 DIAGNOSIS — I214 Non-ST elevation (NSTEMI) myocardial infarction: Secondary | ICD-10-CM | POA: Diagnosis not present

## 2024-05-13 DIAGNOSIS — I2510 Atherosclerotic heart disease of native coronary artery without angina pectoris: Secondary | ICD-10-CM | POA: Diagnosis not present

## 2024-05-13 DIAGNOSIS — G4733 Obstructive sleep apnea (adult) (pediatric): Secondary | ICD-10-CM | POA: Diagnosis not present

## 2024-05-13 DIAGNOSIS — I10 Essential (primary) hypertension: Secondary | ICD-10-CM | POA: Diagnosis not present

## 2024-05-13 DIAGNOSIS — Z955 Presence of coronary angioplasty implant and graft: Secondary | ICD-10-CM | POA: Diagnosis not present

## 2024-05-13 DIAGNOSIS — R0683 Snoring: Secondary | ICD-10-CM | POA: Diagnosis not present

## 2024-05-17 DIAGNOSIS — I2510 Atherosclerotic heart disease of native coronary artery without angina pectoris: Secondary | ICD-10-CM | POA: Diagnosis not present

## 2024-05-17 DIAGNOSIS — Z9861 Coronary angioplasty status: Secondary | ICD-10-CM | POA: Diagnosis not present

## 2024-05-17 DIAGNOSIS — I214 Non-ST elevation (NSTEMI) myocardial infarction: Secondary | ICD-10-CM | POA: Diagnosis not present

## 2024-05-17 DIAGNOSIS — Z955 Presence of coronary angioplasty implant and graft: Secondary | ICD-10-CM | POA: Diagnosis not present

## 2024-05-18 DIAGNOSIS — Z955 Presence of coronary angioplasty implant and graft: Secondary | ICD-10-CM | POA: Diagnosis not present

## 2024-05-18 DIAGNOSIS — Z9861 Coronary angioplasty status: Secondary | ICD-10-CM | POA: Diagnosis not present

## 2024-05-18 DIAGNOSIS — I2510 Atherosclerotic heart disease of native coronary artery without angina pectoris: Secondary | ICD-10-CM | POA: Diagnosis not present

## 2024-05-18 DIAGNOSIS — I214 Non-ST elevation (NSTEMI) myocardial infarction: Secondary | ICD-10-CM | POA: Diagnosis not present

## 2024-05-19 DIAGNOSIS — D539 Nutritional anemia, unspecified: Secondary | ICD-10-CM | POA: Diagnosis not present

## 2024-05-19 DIAGNOSIS — K5909 Other constipation: Secondary | ICD-10-CM | POA: Diagnosis not present

## 2024-05-19 DIAGNOSIS — I10 Essential (primary) hypertension: Secondary | ICD-10-CM | POA: Diagnosis not present

## 2024-05-19 DIAGNOSIS — E0590 Thyrotoxicosis, unspecified without thyrotoxic crisis or storm: Secondary | ICD-10-CM | POA: Diagnosis not present

## 2024-05-19 DIAGNOSIS — D649 Anemia, unspecified: Secondary | ICD-10-CM | POA: Diagnosis not present

## 2024-05-19 DIAGNOSIS — R946 Abnormal results of thyroid function studies: Secondary | ICD-10-CM | POA: Diagnosis not present

## 2024-05-19 DIAGNOSIS — I2510 Atherosclerotic heart disease of native coronary artery without angina pectoris: Secondary | ICD-10-CM | POA: Diagnosis not present

## 2024-05-19 DIAGNOSIS — I252 Old myocardial infarction: Secondary | ICD-10-CM | POA: Diagnosis not present

## 2024-05-19 DIAGNOSIS — E1169 Type 2 diabetes mellitus with other specified complication: Secondary | ICD-10-CM | POA: Diagnosis not present

## 2024-05-20 DIAGNOSIS — Z955 Presence of coronary angioplasty implant and graft: Secondary | ICD-10-CM | POA: Diagnosis not present

## 2024-05-20 DIAGNOSIS — I2510 Atherosclerotic heart disease of native coronary artery without angina pectoris: Secondary | ICD-10-CM | POA: Diagnosis not present

## 2024-05-20 DIAGNOSIS — Z9861 Coronary angioplasty status: Secondary | ICD-10-CM | POA: Diagnosis not present

## 2024-05-20 DIAGNOSIS — I214 Non-ST elevation (NSTEMI) myocardial infarction: Secondary | ICD-10-CM | POA: Diagnosis not present

## 2024-05-24 DIAGNOSIS — I214 Non-ST elevation (NSTEMI) myocardial infarction: Secondary | ICD-10-CM | POA: Diagnosis not present

## 2024-05-24 DIAGNOSIS — I2510 Atherosclerotic heart disease of native coronary artery without angina pectoris: Secondary | ICD-10-CM | POA: Diagnosis not present

## 2024-05-24 DIAGNOSIS — Z955 Presence of coronary angioplasty implant and graft: Secondary | ICD-10-CM | POA: Diagnosis not present

## 2024-05-24 DIAGNOSIS — Z9861 Coronary angioplasty status: Secondary | ICD-10-CM | POA: Diagnosis not present

## 2024-05-25 DIAGNOSIS — Z955 Presence of coronary angioplasty implant and graft: Secondary | ICD-10-CM | POA: Diagnosis not present

## 2024-05-25 DIAGNOSIS — I214 Non-ST elevation (NSTEMI) myocardial infarction: Secondary | ICD-10-CM | POA: Diagnosis not present

## 2024-05-25 DIAGNOSIS — I2510 Atherosclerotic heart disease of native coronary artery without angina pectoris: Secondary | ICD-10-CM | POA: Diagnosis not present

## 2024-05-25 DIAGNOSIS — Z9861 Coronary angioplasty status: Secondary | ICD-10-CM | POA: Diagnosis not present

## 2024-05-27 DIAGNOSIS — I2510 Atherosclerotic heart disease of native coronary artery without angina pectoris: Secondary | ICD-10-CM | POA: Diagnosis not present

## 2024-05-27 DIAGNOSIS — Z9861 Coronary angioplasty status: Secondary | ICD-10-CM | POA: Diagnosis not present

## 2024-05-27 DIAGNOSIS — Z955 Presence of coronary angioplasty implant and graft: Secondary | ICD-10-CM | POA: Diagnosis not present

## 2024-05-27 DIAGNOSIS — I214 Non-ST elevation (NSTEMI) myocardial infarction: Secondary | ICD-10-CM | POA: Diagnosis not present

## 2024-06-07 DIAGNOSIS — Z9861 Coronary angioplasty status: Secondary | ICD-10-CM | POA: Diagnosis not present

## 2024-06-07 DIAGNOSIS — I214 Non-ST elevation (NSTEMI) myocardial infarction: Secondary | ICD-10-CM | POA: Diagnosis not present

## 2024-06-07 DIAGNOSIS — Z955 Presence of coronary angioplasty implant and graft: Secondary | ICD-10-CM | POA: Diagnosis not present

## 2024-06-07 DIAGNOSIS — I2510 Atherosclerotic heart disease of native coronary artery without angina pectoris: Secondary | ICD-10-CM | POA: Diagnosis not present

## 2024-06-08 DIAGNOSIS — I214 Non-ST elevation (NSTEMI) myocardial infarction: Secondary | ICD-10-CM | POA: Diagnosis not present

## 2024-06-08 DIAGNOSIS — I2510 Atherosclerotic heart disease of native coronary artery without angina pectoris: Secondary | ICD-10-CM | POA: Diagnosis not present

## 2024-06-08 DIAGNOSIS — Z955 Presence of coronary angioplasty implant and graft: Secondary | ICD-10-CM | POA: Diagnosis not present

## 2024-06-08 DIAGNOSIS — Z9861 Coronary angioplasty status: Secondary | ICD-10-CM | POA: Diagnosis not present

## 2024-06-10 DIAGNOSIS — Z955 Presence of coronary angioplasty implant and graft: Secondary | ICD-10-CM | POA: Diagnosis not present

## 2024-06-10 DIAGNOSIS — Z9861 Coronary angioplasty status: Secondary | ICD-10-CM | POA: Diagnosis not present

## 2024-06-10 DIAGNOSIS — I214 Non-ST elevation (NSTEMI) myocardial infarction: Secondary | ICD-10-CM | POA: Diagnosis not present

## 2024-06-10 DIAGNOSIS — I2510 Atherosclerotic heart disease of native coronary artery without angina pectoris: Secondary | ICD-10-CM | POA: Diagnosis not present

## 2024-06-14 DIAGNOSIS — Z9861 Coronary angioplasty status: Secondary | ICD-10-CM | POA: Diagnosis not present

## 2024-06-14 DIAGNOSIS — I2510 Atherosclerotic heart disease of native coronary artery without angina pectoris: Secondary | ICD-10-CM | POA: Diagnosis not present

## 2024-06-14 DIAGNOSIS — I214 Non-ST elevation (NSTEMI) myocardial infarction: Secondary | ICD-10-CM | POA: Diagnosis not present

## 2024-06-14 DIAGNOSIS — Z955 Presence of coronary angioplasty implant and graft: Secondary | ICD-10-CM | POA: Diagnosis not present

## 2024-06-15 DIAGNOSIS — Z955 Presence of coronary angioplasty implant and graft: Secondary | ICD-10-CM | POA: Diagnosis not present

## 2024-06-15 DIAGNOSIS — Z9861 Coronary angioplasty status: Secondary | ICD-10-CM | POA: Diagnosis not present

## 2024-06-15 DIAGNOSIS — I214 Non-ST elevation (NSTEMI) myocardial infarction: Secondary | ICD-10-CM | POA: Diagnosis not present

## 2024-06-15 DIAGNOSIS — I2510 Atherosclerotic heart disease of native coronary artery without angina pectoris: Secondary | ICD-10-CM | POA: Diagnosis not present

## 2024-06-17 DIAGNOSIS — I214 Non-ST elevation (NSTEMI) myocardial infarction: Secondary | ICD-10-CM | POA: Diagnosis not present

## 2024-06-17 DIAGNOSIS — Z955 Presence of coronary angioplasty implant and graft: Secondary | ICD-10-CM | POA: Diagnosis not present

## 2024-06-17 DIAGNOSIS — I2510 Atherosclerotic heart disease of native coronary artery without angina pectoris: Secondary | ICD-10-CM | POA: Diagnosis not present

## 2024-06-17 DIAGNOSIS — Z9861 Coronary angioplasty status: Secondary | ICD-10-CM | POA: Diagnosis not present

## 2024-06-21 DIAGNOSIS — Z955 Presence of coronary angioplasty implant and graft: Secondary | ICD-10-CM | POA: Diagnosis not present

## 2024-06-21 DIAGNOSIS — I2510 Atherosclerotic heart disease of native coronary artery without angina pectoris: Secondary | ICD-10-CM | POA: Diagnosis not present

## 2024-06-21 DIAGNOSIS — Z9861 Coronary angioplasty status: Secondary | ICD-10-CM | POA: Diagnosis not present

## 2024-06-21 DIAGNOSIS — I214 Non-ST elevation (NSTEMI) myocardial infarction: Secondary | ICD-10-CM | POA: Diagnosis not present

## 2024-06-22 DIAGNOSIS — Z955 Presence of coronary angioplasty implant and graft: Secondary | ICD-10-CM | POA: Diagnosis not present

## 2024-06-22 DIAGNOSIS — I214 Non-ST elevation (NSTEMI) myocardial infarction: Secondary | ICD-10-CM | POA: Diagnosis not present

## 2024-06-22 DIAGNOSIS — Z9861 Coronary angioplasty status: Secondary | ICD-10-CM | POA: Diagnosis not present

## 2024-06-22 DIAGNOSIS — I2510 Atherosclerotic heart disease of native coronary artery without angina pectoris: Secondary | ICD-10-CM | POA: Diagnosis not present

## 2024-06-24 DIAGNOSIS — Z955 Presence of coronary angioplasty implant and graft: Secondary | ICD-10-CM | POA: Diagnosis not present

## 2024-06-24 DIAGNOSIS — I214 Non-ST elevation (NSTEMI) myocardial infarction: Secondary | ICD-10-CM | POA: Diagnosis not present

## 2024-06-24 DIAGNOSIS — Z9861 Coronary angioplasty status: Secondary | ICD-10-CM | POA: Diagnosis not present

## 2024-06-24 DIAGNOSIS — I2510 Atherosclerotic heart disease of native coronary artery without angina pectoris: Secondary | ICD-10-CM | POA: Diagnosis not present

## 2024-06-28 DIAGNOSIS — I214 Non-ST elevation (NSTEMI) myocardial infarction: Secondary | ICD-10-CM | POA: Diagnosis not present

## 2024-06-28 DIAGNOSIS — I2510 Atherosclerotic heart disease of native coronary artery without angina pectoris: Secondary | ICD-10-CM | POA: Diagnosis not present

## 2024-06-28 DIAGNOSIS — Z955 Presence of coronary angioplasty implant and graft: Secondary | ICD-10-CM | POA: Diagnosis not present

## 2024-06-28 DIAGNOSIS — Z9861 Coronary angioplasty status: Secondary | ICD-10-CM | POA: Diagnosis not present

## 2024-06-29 DIAGNOSIS — Z9861 Coronary angioplasty status: Secondary | ICD-10-CM | POA: Diagnosis not present

## 2024-06-29 DIAGNOSIS — I2510 Atherosclerotic heart disease of native coronary artery without angina pectoris: Secondary | ICD-10-CM | POA: Diagnosis not present

## 2024-06-29 DIAGNOSIS — Z955 Presence of coronary angioplasty implant and graft: Secondary | ICD-10-CM | POA: Diagnosis not present

## 2024-06-29 DIAGNOSIS — I214 Non-ST elevation (NSTEMI) myocardial infarction: Secondary | ICD-10-CM | POA: Diagnosis not present

## 2024-07-05 DIAGNOSIS — I25118 Atherosclerotic heart disease of native coronary artery with other forms of angina pectoris: Secondary | ICD-10-CM | POA: Diagnosis not present

## 2024-07-05 DIAGNOSIS — I10 Essential (primary) hypertension: Secondary | ICD-10-CM | POA: Diagnosis not present

## 2024-07-05 DIAGNOSIS — I255 Ischemic cardiomyopathy: Secondary | ICD-10-CM | POA: Diagnosis not present

## 2024-07-05 DIAGNOSIS — I4729 Other ventricular tachycardia: Secondary | ICD-10-CM | POA: Diagnosis not present

## 2024-07-07 DIAGNOSIS — R0683 Snoring: Secondary | ICD-10-CM | POA: Diagnosis not present

## 2024-07-07 DIAGNOSIS — G4733 Obstructive sleep apnea (adult) (pediatric): Secondary | ICD-10-CM | POA: Diagnosis not present
